# Patient Record
Sex: Male | Born: 1985 | Race: White | Hispanic: No | Marital: Single | Smoking: Current every day smoker
Health system: Southern US, Community
[De-identification: ages and names within clinical notes are randomized; demographics above are authoritative.]

## PROBLEM LIST (undated history)

## (undated) DIAGNOSIS — M5126 Other intervertebral disc displacement, lumbar region: Secondary | ICD-10-CM

## (undated) DIAGNOSIS — M1990 Unspecified osteoarthritis, unspecified site: Secondary | ICD-10-CM

## (undated) DIAGNOSIS — E162 Hypoglycemia, unspecified: Secondary | ICD-10-CM

---

## 2008-07-17 ENCOUNTER — Emergency Department (HOSPITAL_COMMUNITY): Admission: EM | Admit: 2008-07-17 | Discharge: 2008-07-17 | Payer: Self-pay | Admitting: Emergency Medicine

## 2015-12-22 ENCOUNTER — Encounter (HOSPITAL_COMMUNITY): Payer: Self-pay | Admitting: Emergency Medicine

## 2015-12-22 ENCOUNTER — Emergency Department (HOSPITAL_COMMUNITY)
Admission: EM | Admit: 2015-12-22 | Discharge: 2015-12-22 | Disposition: A | Discharge status: Home / Self Care | Payer: Self-pay | Attending: Emergency Medicine | Admitting: Emergency Medicine

## 2015-12-22 ENCOUNTER — Emergency Department (HOSPITAL_COMMUNITY): Payer: Self-pay

## 2015-12-22 DIAGNOSIS — Y9289 Other specified places as the place of occurrence of the external cause: Secondary | ICD-10-CM | POA: Insufficient documentation

## 2015-12-22 DIAGNOSIS — R61 Generalized hyperhidrosis: Secondary | ICD-10-CM | POA: Insufficient documentation

## 2015-12-22 DIAGNOSIS — W320XXA Accidental handgun discharge, initial encounter: Secondary | ICD-10-CM | POA: Insufficient documentation

## 2015-12-22 DIAGNOSIS — W3400XA Accidental discharge from unspecified firearms or gun, initial encounter: Secondary | ICD-10-CM

## 2015-12-22 DIAGNOSIS — S61402A Unspecified open wound of left hand, initial encounter: Secondary | ICD-10-CM | POA: Insufficient documentation

## 2015-12-22 DIAGNOSIS — Y998 Other external cause status: Secondary | ICD-10-CM | POA: Insufficient documentation

## 2015-12-22 DIAGNOSIS — Y9389 Activity, other specified: Secondary | ICD-10-CM | POA: Insufficient documentation

## 2015-12-22 DIAGNOSIS — F17200 Nicotine dependence, unspecified, uncomplicated: Secondary | ICD-10-CM | POA: Insufficient documentation

## 2015-12-22 MED ORDER — SODIUM CHLORIDE 0.9 % IV BOLUS (SEPSIS)
1000.0000 mL | Freq: Once | INTRAVENOUS | Status: AC
  Administered 2015-12-22: 1000 mL via INTRAVENOUS

## 2015-12-22 MED ORDER — HYDROMORPHONE HCL 1 MG/ML IJ SOLN
1.0000 mg | Freq: Once | INTRAMUSCULAR | Status: AC
  Administered 2015-12-22: 1 mg via INTRAVENOUS
  Filled 2015-12-22: qty 1

## 2015-12-22 MED ORDER — LIDOCAINE HCL (PF) 1 % IJ SOLN
30.0000 mL | Freq: Once | INTRAMUSCULAR | Status: AC
  Administered 2015-12-22: 30 mL
  Filled 2015-12-22: qty 30

## 2015-12-22 MED ORDER — ONDANSETRON HCL 4 MG/2ML IJ SOLN
4.0000 mg | Freq: Once | INTRAMUSCULAR | Status: AC
  Administered 2015-12-22: 4 mg via INTRAVENOUS
  Filled 2015-12-22: qty 2

## 2015-12-22 MED ORDER — CEFAZOLIN SODIUM 1-5 GM-% IV SOLN
1.0000 g | Freq: Once | INTRAVENOUS | Status: AC
  Administered 2015-12-22: 1 g via INTRAVENOUS
  Filled 2015-12-22: qty 50

## 2015-12-22 MED ORDER — OXYCODONE HCL 5 MG PO TABS: 5.0000 mg | ORAL_TABLET | Freq: Four times a day (QID) | ORAL | Status: DC | PRN

## 2015-12-22 MED ORDER — TETANUS-DIPHTH-ACELL PERTUSSIS 5-2.5-18.5 LF-MCG/0.5 IM SUSP
0.5000 mL | Freq: Once | INTRAMUSCULAR | Status: DC
  Filled 2015-12-22: qty 0.5

## 2015-12-22 MED ORDER — CEPHALEXIN 500 MG PO CAPS: 500.0000 mg | ORAL_CAPSULE | Freq: Two times a day (BID) | ORAL | Status: DC
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
## 2015-12-22 NOTE — ED Notes (Signed)
Patient transported to X-ray 
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
## 2015-12-25 ENCOUNTER — Inpatient Hospital Stay (HOSPITAL_COMMUNITY)
Admission: AD | Admit: 2015-12-25 | Discharge: 2015-12-30 | DRG: 464 | Disposition: A | Discharge status: Home / Self Care | Payer: Self-pay | Source: Ambulatory Visit | Attending: Orthopedic Surgery | Admitting: Orthopedic Surgery

## 2015-12-25 ENCOUNTER — Inpatient Hospital Stay (HOSPITAL_COMMUNITY): Payer: Self-pay | Admitting: Anesthesiology

## 2015-12-25 ENCOUNTER — Encounter (HOSPITAL_COMMUNITY)
Admission: AD | Disposition: A | Discharge status: Home / Self Care | Payer: Self-pay | Source: Ambulatory Visit | Attending: Orthopedic Surgery

## 2015-12-25 ENCOUNTER — Encounter (HOSPITAL_COMMUNITY): Payer: Self-pay

## 2015-12-25 DIAGNOSIS — Z882 Allergy status to sulfonamides status: Secondary | ICD-10-CM

## 2015-12-25 DIAGNOSIS — W3400XA Accidental discharge from unspecified firearms or gun, initial encounter: Secondary | ICD-10-CM

## 2015-12-25 DIAGNOSIS — Z888 Allergy status to other drugs, medicaments and biological substances status: Secondary | ICD-10-CM

## 2015-12-25 DIAGNOSIS — S62307B Unspecified fracture of fifth metacarpal bone, left hand, initial encounter for open fracture: Principal | ICD-10-CM | POA: Diagnosis present

## 2015-12-25 DIAGNOSIS — Z885 Allergy status to narcotic agent status: Secondary | ICD-10-CM

## 2015-12-25 DIAGNOSIS — F1110 Opioid abuse, uncomplicated: Secondary | ICD-10-CM | POA: Diagnosis present

## 2015-12-25 DIAGNOSIS — L0390 Cellulitis, unspecified: Secondary | ICD-10-CM | POA: Diagnosis present

## 2015-12-25 DIAGNOSIS — F17200 Nicotine dependence, unspecified, uncomplicated: Secondary | ICD-10-CM | POA: Diagnosis present

## 2015-12-25 DIAGNOSIS — S61439A Puncture wound without foreign body of unspecified hand, initial encounter: Secondary | ICD-10-CM

## 2015-12-25 HISTORY — DX: Unspecified osteoarthritis, unspecified site: M19.90

## 2015-12-25 HISTORY — DX: Other intervertebral disc displacement, lumbar region: M51.26

## 2015-12-25 HISTORY — PX: I&D EXTREMITY: SHX5045

## 2015-12-25 HISTORY — DX: Hypoglycemia, unspecified: E16.2

## 2015-12-25 LAB — CBC WITH DIFFERENTIAL/PLATELET
BASOS ABS: 0 10*3/uL (ref 0.0–0.1)
Basophils Relative: 0 %
Eosinophils Absolute: 0.1 10*3/uL (ref 0.0–0.7)
Eosinophils Relative: 1 %
HEMATOCRIT: 43.1 % (ref 39.0–52.0)
Hemoglobin: 14.4 g/dL (ref 13.0–17.0)
LYMPHS PCT: 14 %
Lymphs Abs: 1.1 10*3/uL (ref 0.7–4.0)
MCH: 29.9 pg (ref 26.0–34.0)
MCHC: 33.4 g/dL (ref 30.0–36.0)
MCV: 89.4 fL (ref 78.0–100.0)
MONO ABS: 0.7 10*3/uL (ref 0.1–1.0)
Monocytes Relative: 9 %
NEUTROS ABS: 6.2 10*3/uL (ref 1.7–7.7)
Neutrophils Relative %: 76 %
Platelets: 189 10*3/uL (ref 150–400)
RBC: 4.82 MIL/uL (ref 4.22–5.81)
RDW: 12.5 % (ref 11.5–15.5)
WBC: 8.1 10*3/uL (ref 4.0–10.5)

## 2015-12-25 LAB — RAPID URINE DRUG SCREEN, HOSP PERFORMED
Amphetamines: NOT DETECTED
Barbiturates: NOT DETECTED
Benzodiazepines: POSITIVE — AB
Cocaine: NOT DETECTED
OPIATES: POSITIVE — AB
Tetrahydrocannabinol: POSITIVE — AB

## 2015-12-25 LAB — URINALYSIS, ROUTINE W REFLEX MICROSCOPIC
BILIRUBIN URINE: NEGATIVE
Glucose, UA: NEGATIVE mg/dL
HGB URINE DIPSTICK: NEGATIVE
Ketones, ur: NEGATIVE mg/dL
Leukocytes, UA: NEGATIVE
Nitrite: NEGATIVE
PROTEIN: NEGATIVE mg/dL
Specific Gravity, Urine: 1.039 — ABNORMAL HIGH (ref 1.005–1.030)
pH: 6 (ref 5.0–8.0)

## 2015-12-25 LAB — COMPREHENSIVE METABOLIC PANEL
ALK PHOS: 85 U/L (ref 38–126)
ALT: 23 U/L (ref 17–63)
AST: 29 U/L (ref 15–41)
Albumin: 3.6 g/dL (ref 3.5–5.0)
Anion gap: 11 (ref 5–15)
BILIRUBIN TOTAL: 0.3 mg/dL (ref 0.3–1.2)
BUN: 15 mg/dL (ref 6–20)
CALCIUM: 9.1 mg/dL (ref 8.9–10.3)
CO2: 27 mmol/L (ref 22–32)
CREATININE: 0.91 mg/dL (ref 0.61–1.24)
Chloride: 104 mmol/L (ref 101–111)
GFR calc Af Amer: >60 mL/min (ref >60)
Glucose, Bld: 90 mg/dL (ref 65–99)
Potassium: 3.9 mmol/L (ref 3.5–5.1)
Sodium: 142 mmol/L (ref 135–145)
TOTAL PROTEIN: 6.7 g/dL (ref 6.5–8.1)

## 2015-12-25 SURGERY — IRRIGATION AND DEBRIDEMENT EXTREMITY
Anesthesia: General | Laterality: Left

## 2015-12-25 MED ORDER — IBUPROFEN 200 MG PO TABS
800.0000 mg | ORAL_TABLET | Freq: Three times a day (TID) | ORAL | Status: DC | PRN
  Administered 2015-12-25 – 2015-12-29 (×4): 800 mg via ORAL
  Filled 2015-12-25 (×4): qty 4

## 2015-12-25 MED ORDER — MEPERIDINE HCL 25 MG/ML IJ SOLN: 6.2500 mg | INTRAMUSCULAR | Status: DC | PRN

## 2015-12-25 MED ORDER — SUGAMMADEX SODIUM 200 MG/2ML IV SOLN
INTRAVENOUS | Status: DC | PRN
  Administered 2015-12-25: 120 mg via INTRAVENOUS

## 2015-12-25 MED ORDER — LORATADINE 10 MG PO TABS
10.0000 mg | ORAL_TABLET | Freq: Every day | ORAL | Status: DC
  Administered 2015-12-26 – 2015-12-30 (×4): 10 mg via ORAL
  Filled 2015-12-25 (×5): qty 1

## 2015-12-25 MED ORDER — METHOCARBAMOL 500 MG PO TABS
500.0000 mg | ORAL_TABLET | Freq: Four times a day (QID) | ORAL | Status: DC | PRN
  Administered 2015-12-25 – 2015-12-29 (×7): 500 mg via ORAL
  Filled 2015-12-25 (×6): qty 1

## 2015-12-25 MED ORDER — SODIUM CHLORIDE 0.9 % IR SOLN
Status: DC | PRN
  Administered 2015-12-25: 1000 mL

## 2015-12-25 MED ORDER — SUCCINYLCHOLINE CHLORIDE 200 MG/10ML IV SOSY
PREFILLED_SYRINGE | INTRAVENOUS | Status: AC
  Filled 2015-12-25: qty 10

## 2015-12-25 MED ORDER — ONDANSETRON HCL 4 MG/2ML IJ SOLN
INTRAMUSCULAR | Status: DC | PRN
  Administered 2015-12-25: 4 mg via INTRAVENOUS

## 2015-12-25 MED ORDER — LACTATED RINGERS IV SOLN
INTRAVENOUS | Status: DC | PRN
  Administered 2015-12-25 (×2): via INTRAVENOUS

## 2015-12-25 MED ORDER — DOCUSATE SODIUM 100 MG PO CAPS
100.0000 mg | ORAL_CAPSULE | Freq: Two times a day (BID) | ORAL | Status: DC
  Administered 2015-12-26 – 2015-12-30 (×8): 100 mg via ORAL
  Filled 2015-12-25 (×9): qty 1

## 2015-12-25 MED ORDER — SUCCINYLCHOLINE CHLORIDE 20 MG/ML IJ SOLN
INTRAMUSCULAR | Status: DC | PRN
  Administered 2015-12-25: 120 mg via INTRAVENOUS

## 2015-12-25 MED ORDER — CEFAZOLIN SODIUM-DEXTROSE 2-3 GM-% IV SOLR
INTRAVENOUS | Status: DC | PRN
  Administered 2015-12-25: 2 g via INTRAVENOUS

## 2015-12-25 MED ORDER — ACETAMINOPHEN 325 MG PO TABS
650.0000 mg | ORAL_TABLET | ORAL | Status: DC | PRN
  Administered 2015-12-25 – 2015-12-28 (×3): 650 mg via ORAL
  Filled 2015-12-25 (×2): qty 2

## 2015-12-25 MED ORDER — VANCOMYCIN HCL 10 G IV SOLR
1250.0000 mg | Freq: Two times a day (BID) | INTRAVENOUS | Status: DC
  Administered 2015-12-25 – 2015-12-30 (×9): 1250 mg via INTRAVENOUS
  Filled 2015-12-25 (×11): qty 1250

## 2015-12-25 MED ORDER — OXYCODONE HCL 5 MG PO TABS
ORAL_TABLET | ORAL | Status: AC
  Filled 2015-12-25: qty 2

## 2015-12-25 MED ORDER — HYDROMORPHONE HCL 1 MG/ML IJ SOLN
INTRAMUSCULAR | Status: AC
  Filled 2015-12-25: qty 1

## 2015-12-25 MED ORDER — LIDOCAINE HCL (CARDIAC) 20 MG/ML IV SOLN
INTRAVENOUS | Status: DC | PRN
  Administered 2015-12-25: 100 mg via INTRAVENOUS

## 2015-12-25 MED ORDER — METHOCARBAMOL 500 MG PO TABS
ORAL_TABLET | ORAL | Status: AC
  Filled 2015-12-25: qty 1

## 2015-12-25 MED ORDER — ONDANSETRON HCL 4 MG PO TABS
4.0000 mg | ORAL_TABLET | Freq: Four times a day (QID) | ORAL | Status: DC | PRN
Start: 2015-12-25 — End: 2015-12-30

## 2015-12-25 MED ORDER — ALPRAZOLAM 0.5 MG PO TABS
0.5000 mg | ORAL_TABLET | Freq: Four times a day (QID) | ORAL | Status: DC | PRN
  Administered 2015-12-25 – 2015-12-28 (×4): 0.5 mg via ORAL
  Filled 2015-12-25 (×4): qty 1

## 2015-12-25 MED ORDER — PROMETHAZINE HCL 25 MG RE SUPP: 12.5000 mg | Freq: Four times a day (QID) | RECTAL | Status: DC | PRN

## 2015-12-25 MED ORDER — LIDOCAINE 2% (20 MG/ML) 5 ML SYRINGE
INTRAMUSCULAR | Status: AC
  Filled 2015-12-25: qty 5

## 2015-12-25 MED ORDER — METHOCARBAMOL 1000 MG/10ML IJ SOLN
500.0000 mg | Freq: Four times a day (QID) | INTRAVENOUS | Status: DC | PRN
  Filled 2015-12-25: qty 5

## 2015-12-25 MED ORDER — MIDAZOLAM HCL 2 MG/2ML IJ SOLN
INTRAMUSCULAR | Status: AC
  Filled 2015-12-25: qty 2

## 2015-12-25 MED ORDER — MORPHINE SULFATE (PF) 2 MG/ML IV SOLN
2.0000 mg | INTRAVENOUS | Status: DC | PRN
  Administered 2015-12-27 (×2): 4 mg via INTRAVENOUS
  Administered 2015-12-27: 2 mg via INTRAVENOUS
  Administered 2015-12-27 – 2015-12-28 (×5): 4 mg via INTRAVENOUS
  Filled 2015-12-25 (×8): qty 2

## 2015-12-25 MED ORDER — PROPOFOL 10 MG/ML IV BOLUS
INTRAVENOUS | Status: DC | PRN
  Administered 2015-12-25: 200 mg via INTRAVENOUS

## 2015-12-25 MED ORDER — SENNA 8.6 MG PO TABS
1.0000 | ORAL_TABLET | Freq: Two times a day (BID) | ORAL | Status: DC
  Administered 2015-12-26 – 2015-12-30 (×8): 8.6 mg via ORAL
  Filled 2015-12-25 (×9): qty 1

## 2015-12-25 MED ORDER — ONDANSETRON HCL 4 MG/2ML IJ SOLN: 4.0000 mg | Freq: Four times a day (QID) | INTRAMUSCULAR | Status: DC | PRN

## 2015-12-25 MED ORDER — FENTANYL CITRATE (PF) 250 MCG/5ML IJ SOLN
INTRAMUSCULAR | Status: AC
  Filled 2015-12-25: qty 5

## 2015-12-25 MED ORDER — POLYETHYLENE GLYCOL 3350 17 G PO PACK: 17.0000 g | PACK | Freq: Every day | ORAL | Status: DC | PRN

## 2015-12-25 MED ORDER — SODIUM CHLORIDE 0.9 % IV SOLN
1.5000 g | Freq: Four times a day (QID) | INTRAVENOUS | Status: DC
  Administered 2015-12-25 – 2015-12-30 (×18): 1.5 g via INTRAVENOUS
  Filled 2015-12-25 (×21): qty 1.5

## 2015-12-25 MED ORDER — MAGNESIUM CITRATE PO SOLN: 1.0000 | Freq: Once | ORAL | Status: DC | PRN

## 2015-12-25 MED ORDER — DIPHENHYDRAMINE HCL 25 MG PO CAPS
25.0000 mg | ORAL_CAPSULE | Freq: Four times a day (QID) | ORAL | Status: DC | PRN
  Administered 2015-12-28: 25 mg via ORAL
  Filled 2015-12-25: qty 1

## 2015-12-25 MED ORDER — ONDANSETRON HCL 4 MG/2ML IJ SOLN
INTRAMUSCULAR | Status: AC
  Filled 2015-12-25: qty 2

## 2015-12-25 MED ORDER — BISACODYL 10 MG RE SUPP: 10.0000 mg | Freq: Every day | RECTAL | Status: DC | PRN

## 2015-12-25 MED ORDER — FENTANYL CITRATE (PF) 100 MCG/2ML IJ SOLN
INTRAMUSCULAR | Status: DC | PRN
Start: 2015-12-25 — End: 2015-12-25
  Administered 2015-12-25: 150 ug via INTRAVENOUS
  Administered 2015-12-25: 100 ug via INTRAVENOUS

## 2015-12-25 MED ORDER — LACTATED RINGERS IV SOLN
INTRAVENOUS | Status: DC
  Administered 2015-12-25: 17:00:00 via INTRAVENOUS

## 2015-12-25 MED ORDER — POTASSIUM CHLORIDE IN NACL 20-0.45 MEQ/L-% IV SOLN
INTRAVENOUS | Status: DC
  Administered 2015-12-25 – 2015-12-27 (×3): via INTRAVENOUS
  Filled 2015-12-25 (×14): qty 1000

## 2015-12-25 MED ORDER — VITAMIN C 500 MG PO TABS
1000.0000 mg | ORAL_TABLET | Freq: Every day | ORAL | Status: DC
  Administered 2015-12-26 – 2015-12-30 (×4): 1000 mg via ORAL
  Filled 2015-12-25 (×5): qty 2

## 2015-12-25 MED ORDER — FAMOTIDINE 20 MG PO TABS: 20.0000 mg | ORAL_TABLET | Freq: Two times a day (BID) | ORAL | Status: DC | PRN

## 2015-12-25 MED ORDER — MIDAZOLAM HCL 5 MG/5ML IJ SOLN
INTRAMUSCULAR | Status: DC | PRN
  Administered 2015-12-25: 2 mg via INTRAVENOUS

## 2015-12-25 MED ORDER — ONDANSETRON HCL 4 MG/2ML IJ SOLN: 4.0000 mg | Freq: Once | INTRAMUSCULAR | Status: DC | PRN

## 2015-12-25 MED ORDER — ROCURONIUM BROMIDE 100 MG/10ML IV SOLN
INTRAVENOUS | Status: DC | PRN
  Administered 2015-12-25: 30 mg via INTRAVENOUS

## 2015-12-25 MED ORDER — ACETAMINOPHEN 325 MG PO TABS
ORAL_TABLET | ORAL | Status: AC
  Filled 2015-12-25: qty 2

## 2015-12-25 MED ORDER — ROCURONIUM BROMIDE 50 MG/5ML IV SOLN
INTRAVENOUS | Status: AC
  Filled 2015-12-25: qty 1

## 2015-12-25 MED ORDER — B COMPLEX-C PO TABS
1.0000 | ORAL_TABLET | Freq: Every day | ORAL | Status: DC
  Administered 2015-12-26 – 2015-12-30 (×4): 1 via ORAL
  Filled 2015-12-25 (×5): qty 1

## 2015-12-25 MED ORDER — HYDROMORPHONE HCL 1 MG/ML IJ SOLN
0.2500 mg | INTRAMUSCULAR | Status: DC | PRN
  Administered 2015-12-25 (×4): 0.5 mg via INTRAVENOUS

## 2015-12-25 MED ORDER — OXYCODONE HCL 5 MG PO TABS
5.0000 mg | ORAL_TABLET | ORAL | Status: DC | PRN
  Administered 2015-12-25 – 2015-12-26 (×4): 10 mg via ORAL
  Administered 2015-12-26: 5 mg via ORAL
  Administered 2015-12-26 – 2015-12-29 (×11): 10 mg via ORAL
  Filled 2015-12-25 (×15): qty 2

## 2015-12-25 MED ORDER — SUGAMMADEX SODIUM 200 MG/2ML IV SOLN
INTRAVENOUS | Status: AC
  Filled 2015-12-25: qty 2

## 2015-12-25 SURGICAL SUPPLY — 49 items
BANDAGE ELASTIC 4 VELCRO ST LF (GAUZE/BANDAGES/DRESSINGS) IMPLANT
BNDG CONFORM 2 STRL LF (GAUZE/BANDAGES/DRESSINGS) IMPLANT
BNDG GAUZE ELAST 4 BULKY (GAUZE/BANDAGES/DRESSINGS) IMPLANT
CORDS BIPOLAR (ELECTRODE) ×3 IMPLANT
CUFF TOURNIQUET SINGLE 18IN (TOURNIQUET CUFF) ×3 IMPLANT
CUFF TOURNIQUET SINGLE 24IN (TOURNIQUET CUFF) IMPLANT
DRAPE OEC MINIVIEW 54X84 (DRAPES) ×3 IMPLANT
DRSG ADAPTIC 3X8 NADH LF (GAUZE/BANDAGES/DRESSINGS) ×3 IMPLANT
GAUZE SPONGE 4X4 12PLY STRL (GAUZE/BANDAGES/DRESSINGS) IMPLANT
GAUZE SPONGE 4X4 16PLY XRAY LF (GAUZE/BANDAGES/DRESSINGS) ×3 IMPLANT
GAUZE XEROFORM 1X8 LF (GAUZE/BANDAGES/DRESSINGS) IMPLANT
GAUZE XEROFORM 5X9 LF (GAUZE/BANDAGES/DRESSINGS) ×3 IMPLANT
GLOVE BIOGEL M 8.0 STRL (GLOVE) ×3 IMPLANT
GLOVE BIOGEL PI IND STRL 7.5 (GLOVE) ×1 IMPLANT
GLOVE BIOGEL PI INDICATOR 7.5 (GLOVE) ×2
GLOVE SS BIOGEL STRL SZ 8 (GLOVE) ×1 IMPLANT
GLOVE SUPERSENSE BIOGEL SZ 8 (GLOVE) ×2
GOWN STRL REUS W/ TWL LRG LVL3 (GOWN DISPOSABLE) ×1 IMPLANT
GOWN STRL REUS W/ TWL XL LVL3 (GOWN DISPOSABLE) ×1 IMPLANT
GOWN STRL REUS W/TWL LRG LVL3 (GOWN DISPOSABLE) ×2
GOWN STRL REUS W/TWL XL LVL3 (GOWN DISPOSABLE) ×2
HANDPIECE INTERPULSE COAX TIP (DISPOSABLE)
KIT BASIN OR (CUSTOM PROCEDURE TRAY) ×3 IMPLANT
KIT ROOM TURNOVER OR (KITS) ×3 IMPLANT
LOOP VESSEL MAXI BLUE (MISCELLANEOUS) ×6 IMPLANT
MANIFOLD NEPTUNE II (INSTRUMENTS) ×3 IMPLANT
NEEDLE HYPO 25GX1X1/2 BEV (NEEDLE) IMPLANT
NS IRRIG 1000ML POUR BTL (IV SOLUTION) ×3 IMPLANT
PACK ORTHO EXTREMITY (CUSTOM PROCEDURE TRAY) ×3 IMPLANT
PAD ARMBOARD 7.5X6 YLW CONV (MISCELLANEOUS) ×3 IMPLANT
PAD CAST 4YDX4 CTTN HI CHSV (CAST SUPPLIES) IMPLANT
PADDING CAST ABS 4INX4YD NS (CAST SUPPLIES) ×2
PADDING CAST ABS COTTON 4X4 ST (CAST SUPPLIES) ×1 IMPLANT
PADDING CAST COTTON 4X4 STRL (CAST SUPPLIES)
PILLOW ARM CARTER ADULT (MISCELLANEOUS) ×3 IMPLANT
SET HNDPC FAN SPRY TIP SCT (DISPOSABLE) IMPLANT
SPONGE GAUZE 4X4 12PLY STER LF (GAUZE/BANDAGES/DRESSINGS) ×3 IMPLANT
SPONGE LAP 4X18 X RAY DECT (DISPOSABLE) IMPLANT
SUCTION FRAZIER HANDLE 10FR (MISCELLANEOUS) ×2
SUCTION TUBE FRAZIER 10FR DISP (MISCELLANEOUS) ×1 IMPLANT
SWAB COLLECTION DEVICE MRSA (MISCELLANEOUS) ×3 IMPLANT
SYR CONTROL 10ML LL (SYRINGE) IMPLANT
TOWEL OR 17X24 6PK STRL BLUE (TOWEL DISPOSABLE) ×6 IMPLANT
TOWEL OR 17X26 10 PK STRL BLUE (TOWEL DISPOSABLE) ×3 IMPLANT
TUBE ANAEROBIC SPECIMEN COL (MISCELLANEOUS) IMPLANT
TUBE CONNECTING 12'X1/4 (SUCTIONS) ×1
TUBE CONNECTING 12X1/4 (SUCTIONS) ×2 IMPLANT
WATER STERILE IRR 1000ML POUR (IV SOLUTION) ×3 IMPLANT
YANKAUER SUCT BULB TIP NO VENT (SUCTIONS) ×3 IMPLANT

## 2015-12-25 NOTE — H&P (Deleted)
  Patient seen and examined Patient is doing fairly well.  Neurovascularly he is stable about the right and left upper extremities  We'll plan for irrigation debridement possible skin grafting and repair reconstruction left arm is necessary.  We'll plan for dressing change right upper extremity and irrigation debridement of the skin.  These notes a been discussed all questions addressed.  We are planning surgery for your upper extremity. The risk and benefits of surgery to include risk of bleeding, infection, anesthesia,  damage to normal structures and failure of the surgery to accomplish its intended goals of relieving symptoms and restoring function have been discussed in detail. With this in mind we plan to proceed. I have specifically discussed with the patient the pre-and postoperative regime and the dos and don'ts and risk and benefits in great detail. Risk and benefits of surgery also include risk of dystrophy(CRPS), chronic nerve pain, failure of the healing process to go onto completion and other inherent risks of surgery The relavent the pathophysiology of the disease/injury process, as well as the alternatives for treatment and postoperative course of action has been discussed in great detail with the patient who desires to proceed.  We will do everything in our power to help you (the patient) restore function to the upper extremity. It is a pleasure to see this patient today.  Jensyn Shave MD
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
## 2015-12-25 NOTE — Transfer of Care (Signed)
Immediate Anesthesia Transfer of Care Note  Patient: Bobby Henderson  Procedure(s) Performed: Procedure(s): IRRIGATION AND DEBRIDEMENT EXTREMITY (Left)  Patient Location: PACU  Anesthesia Type:General  Level of Consciousness: awake, alert , oriented and patient cooperative  Airway & Oxygen Therapy: Patient Spontanous Breathing and Patient connected to nasal cannula oxygen  Post-op Assessment: Report given to RN, Post -op Vital signs reviewed and stable and Patient moving all extremities X 4  Post vital signs: Reviewed and stable  Last Vitals:  Filed Vitals:   12/25/15 1604 12/25/15 1939  BP: 144/86   Pulse: 80   Temp: 36.8 C 36.8 C  Resp: 18     Last Pain:  Filed Vitals:   12/25/15 1941  PainSc: 5       Patients Stated Pain Goal: 2 (12/25/15 1619)  Complications: No apparent anesthesia complications

## 2015-12-25 NOTE — Progress Notes (Signed)
Pt now able to void, urine sample sent to lab for urinalysis and drug screen.

## 2015-12-25 NOTE — Anesthesia Postprocedure Evaluation (Signed)
Anesthesia Post Note  Patient: Bobby Henderson  Procedure(s) Performed: Procedure(s) (LRB): IRRIGATION AND DEBRIDEMENT EXTREMITY (Left)  Patient location during evaluation: PACU Anesthesia Type: General Level of consciousness: awake and alert, oriented and patient cooperative Pain control: pain improving. Vital Signs Assessment: post-procedure vital signs reviewed and stable Respiratory status: spontaneous breathing, nonlabored ventilation and respiratory function stable Cardiovascular status: blood pressure returned to baseline and stable Postop Assessment: no signs of nausea or vomiting Anesthetic complications: no    Last Vitals:  Filed Vitals:   12/25/15 2043 12/25/15 2108  BP:  128/77  Pulse: 73 75  Temp: 36.8 C 36.9 C  Resp: 13 16    Last Pain:  Filed Vitals:   12/25/15 2234  PainSc: 8                  Andray Assefa,E. Raygen Dahm

## 2015-12-25 NOTE — Op Note (Signed)
See [id number]#[id number] Bobby PeaGramig MD
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
## 2015-12-25 NOTE — Anesthesia Procedure Notes (Signed)
Procedure Name: Intubation Date/Time: 12/25/2015 6:22 PM Performed by: Izora Gala Pre-anesthesia Checklist: Emergency Drugs available, Suction available, Patient being monitored and Patient identified Patient Re-evaluated:Patient Re-evaluated prior to inductionOxygen Delivery Method: Circle system utilized Preoxygenation: Pre-oxygenation with 100% oxygen Intubation Type: IV induction Ventilation: Mask ventilation without difficulty Laryngoscope Size: Miller and 3 Grade View: Grade I Tube type: Oral Tube size: 7.5 mm Number of attempts: 1 Airway Equipment and Method: Stylet and LTA kit utilized Placement Confirmation: ETT inserted through vocal cords under direct vision,  positive ETCO2 and breath sounds checked- equal and bilateral Secured at: 21 cm Tube secured with: Tape Dental Injury: Teeth and Oropharynx as per pre-operative assessment

## 2015-12-25 NOTE — Anesthesia Preprocedure Evaluation (Signed)
Anesthesia Evaluation  Patient identified by MRN, date of birth, ID band Patient awake    Reviewed: Allergy & Precautions, NPO status , Patient's Chart, lab work & pertinent test results  Airway Mallampati: I  TM Distance: >3 FB Neck ROM: Full    Dental   Pulmonary Current Smoker,    Pulmonary exam normal        Cardiovascular Normal cardiovascular exam     Neuro/Psych    GI/Hepatic   Endo/Other    Renal/GU      Musculoskeletal   Abdominal   Peds  Hematology   Anesthesia Other Findings   Reproductive/Obstetrics                             Anesthesia Physical Anesthesia Plan  ASA: II  Anesthesia Plan: General   Post-op Pain Management:    Induction: Intravenous  Airway Management Planned: Oral ETT  Additional Equipment:   Intra-op Plan:   Post-operative Plan: Extubation in OR  Informed Consent: I have reviewed the patients History and Physical, chart, labs and discussed the procedure including the risks, benefits and alternatives for the proposed anesthesia with the patient or authorized representative who has indicated his/her understanding and acceptance.     Plan Discussed with: CRNA and Surgeon  Anesthesia Plan Comments:         Anesthesia Quick Evaluation  

## 2015-12-26 LAB — C-REACTIVE PROTEIN: CRP: 6.3 mg/dL — ABNORMAL HIGH (ref <1.0)

## 2015-12-26 LAB — SEDIMENTATION RATE: Sed Rate: 15 mm/hr (ref 0–16)

## 2015-12-26 NOTE — Progress Notes (Signed)
Subjective: 1 Day Post-Op Procedure(s) (LRB): IRRIGATION AND DEBRIDEMENT EXTREMITY (Left) Patient reports being anxious and mild tenderness. Denies fever or chills, shortness of breath, chest pain.  Objective: Vital signs in last 24 hours: Temp:  [97.7 F (36.5 C)-98.4 F (36.9 C)] 98.4 F (36.9 C) (05/06 1211) Pulse Rate:  [53-83] 74 (05/06 1211) Resp:  [7-17] 15 (05/06 1211) BP: (100-153)/(61-99) 117/64 mmHg (05/06 1211) SpO2:  [99 %-100 %] 100 % (05/06 1211)  Intake/Output from previous day: 05/05 0701 - 05/06 0700 In: 2431.7 [P.O.:120; I.V.:1961.7; IV Piggyback:350] Out: 5 [Blood:5] Intake/Output this shift: Total I/O In: 120 [P.O.:120] Out: -    Recent Labs  12/25/15 1618  HGB 14.4    Recent Labs  12/25/15 1618  WBC 8.1  RBC 4.82  HCT 43.1  PLT 189    Recent Labs  12/25/15 1618  NA 142  K 3.9  CL 104  CO2 27  BUN 15  CREATININE 0.91  GLUCOSE 90  CALCIUM 9.1   No results for input(s): LABPT, INR in the last 72 hours.  Patient is pleasant, in no acute distress. He is sitting up in a chair. He is tolerating by mouth's without difficulties.  HEENT: Noted abrasion with mild ecchymosis about the left periorbital region Chest equal expansions are present respirations are nonlabored Abdomen nontender Left upper extremity: His Ace wrap as and April repair this appears to have been unwrapped and rewrapped. He states that it felt too tight and he asked the nurse to rewrap it. Range of motion intact sensation refill are intact. There is no signs of ascending cellulitis present. I have rewrapped his splint.  Assessment/Plan: 1 Day Post-Op Procedure(s) (LRB): IRRIGATION AND DEBRIDEMENT EXTREMITY (Left) We have discussed with him the extensive nature of his infection and the need to return to the operative suite tomorrow for repeat irrigation and excisional debridement. He will most likely need some degree of definitive bone fixation in the near future once the  infection subsides. We discussed these issues with him. Intraoperative cultures are still pending. We will plan for nothing by mouth after midnight tonight and will return to the operative suite tomorrow. We have discussed with the patient the issues regarding their infection to the extremity. We will continue antibiotics and await culture results. Often times it will take 3-5 days for cultures to become final. During this time we will typically have the patient on intravenous antibiotics until we can find a parenteral route of antibiotic regime specific for the bacteria or organism isolated. We have discussed with the patient the need for daily irrigation and debridement as well as therapy to the area. We have discussed with the patient the necessity of range of motion to the involved joints as discussed today. We have discussed with the patient the unpredictability of infections at times. We'll continue to work towards good pain control and restoration of function. The patient understands the need for meticulous wound care and the necessity of proper followup.  The possible complications of stiffness (loss of motion), resistant infection, possible deep bone infection, possible chronic pain issues, possible need for multiple surgeries and even amputation.  With this in mind the patient understands our goal is to eradicate the infection to quiesence. We will continue to work towards these goals.  Bobby Henderson 12/26/2015, 5:08 PM
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
## 2015-12-27 ENCOUNTER — Encounter (HOSPITAL_COMMUNITY)
Admission: AD | Disposition: A | Discharge status: Home / Self Care | Payer: Self-pay | Source: Ambulatory Visit | Attending: Orthopedic Surgery

## 2015-12-27 ENCOUNTER — Encounter (HOSPITAL_COMMUNITY): Payer: Self-pay | Admitting: Anesthesiology

## 2015-12-27 ENCOUNTER — Inpatient Hospital Stay (HOSPITAL_COMMUNITY): Payer: Self-pay | Admitting: Anesthesiology

## 2015-12-27 HISTORY — PX: I&D EXTREMITY: SHX5045

## 2015-12-27 LAB — GLUCOSE, CAPILLARY: Glucose-Capillary: 106 mg/dL — ABNORMAL HIGH (ref 65–99)

## 2015-12-27 LAB — HEPATIC FUNCTION PANEL
ALK PHOS: 78 U/L (ref 38–126)
ALT: 23 U/L (ref 17–63)
AST: 28 U/L (ref 15–41)
Albumin: 3.3 g/dL — ABNORMAL LOW (ref 3.5–5.0)
BILIRUBIN TOTAL: 0.4 mg/dL (ref 0.3–1.2)
Total Protein: 6.3 g/dL — ABNORMAL LOW (ref 6.5–8.1)

## 2015-12-27 SURGERY — IRRIGATION AND DEBRIDEMENT EXTREMITY
Anesthesia: General | Site: Hand | Laterality: Left

## 2015-12-27 MED ORDER — MEPERIDINE HCL 25 MG/ML IJ SOLN: 6.2500 mg | INTRAMUSCULAR | Status: DC | PRN

## 2015-12-27 MED ORDER — ONDANSETRON HCL 4 MG/2ML IJ SOLN
INTRAMUSCULAR | Status: AC
  Filled 2015-12-27: qty 2

## 2015-12-27 MED ORDER — LIDOCAINE 2% (20 MG/ML) 5 ML SYRINGE
INTRAMUSCULAR | Status: AC
  Filled 2015-12-27: qty 5

## 2015-12-27 MED ORDER — HYDROMORPHONE HCL 1 MG/ML IJ SOLN
0.2500 mg | INTRAMUSCULAR | Status: DC | PRN
Start: 2015-12-27 — End: 2015-12-27
  Administered 2015-12-27 (×4): 0.5 mg via INTRAVENOUS

## 2015-12-27 MED ORDER — FENTANYL CITRATE (PF) 100 MCG/2ML IJ SOLN
INTRAMUSCULAR | Status: DC | PRN
  Administered 2015-12-27 (×2): 100 ug via INTRAVENOUS
  Administered 2015-12-27 (×2): 50 ug via INTRAVENOUS
  Administered 2015-12-27 (×2): 100 ug via INTRAVENOUS

## 2015-12-27 MED ORDER — SODIUM CHLORIDE 0.9 % IR SOLN
Status: DC | PRN
  Administered 2015-12-27: 3000 mL

## 2015-12-27 MED ORDER — PROPOFOL 10 MG/ML IV BOLUS
INTRAVENOUS | Status: AC
  Filled 2015-12-27: qty 20

## 2015-12-27 MED ORDER — MIDAZOLAM HCL 5 MG/5ML IJ SOLN
INTRAMUSCULAR | Status: DC | PRN
  Administered 2015-12-27: 2 mg via INTRAVENOUS

## 2015-12-27 MED ORDER — EPHEDRINE 5 MG/ML INJ
INTRAVENOUS | Status: AC
  Filled 2015-12-27: qty 10

## 2015-12-27 MED ORDER — FENTANYL CITRATE (PF) 250 MCG/5ML IJ SOLN
INTRAMUSCULAR | Status: AC
  Filled 2015-12-27: qty 5

## 2015-12-27 MED ORDER — SUCCINYLCHOLINE CHLORIDE 200 MG/10ML IV SOSY
PREFILLED_SYRINGE | INTRAVENOUS | Status: AC
  Filled 2015-12-27: qty 10

## 2015-12-27 MED ORDER — PHENYLEPHRINE 40 MCG/ML (10ML) SYRINGE FOR IV PUSH (FOR BLOOD PRESSURE SUPPORT)
PREFILLED_SYRINGE | INTRAVENOUS | Status: AC
  Filled 2015-12-27: qty 10

## 2015-12-27 MED ORDER — MIDAZOLAM HCL 2 MG/2ML IJ SOLN
INTRAMUSCULAR | Status: AC
  Filled 2015-12-27: qty 2

## 2015-12-27 MED ORDER — LIDOCAINE HCL (CARDIAC) 20 MG/ML IV SOLN
INTRAVENOUS | Status: DC | PRN
  Administered 2015-12-27: 50 mg via INTRAVENOUS

## 2015-12-27 MED ORDER — LACTATED RINGERS IV SOLN
INTRAVENOUS | Status: DC | PRN
  Administered 2015-12-27: 07:00:00 via INTRAVENOUS

## 2015-12-27 MED ORDER — 0.9 % SODIUM CHLORIDE (POUR BTL) OPTIME
TOPICAL | Status: DC | PRN
  Administered 2015-12-27: 1000 mL

## 2015-12-27 MED ORDER — HYDROMORPHONE HCL 1 MG/ML IJ SOLN
INTRAMUSCULAR | Status: AC
Start: 2015-12-27 — End: 2015-12-28
  Filled 2015-12-27: qty 2

## 2015-12-27 MED ORDER — ARTIFICIAL TEARS OP OINT
TOPICAL_OINTMENT | OPHTHALMIC | Status: AC
  Filled 2015-12-27: qty 3.5

## 2015-12-27 MED ORDER — MIDAZOLAM HCL 2 MG/2ML IJ SOLN: 0.5000 mg | Freq: Once | INTRAMUSCULAR | Status: DC | PRN

## 2015-12-27 MED ORDER — ONDANSETRON HCL 4 MG/2ML IJ SOLN
INTRAMUSCULAR | Status: DC | PRN
  Administered 2015-12-27: 4 mg via INTRAVENOUS

## 2015-12-27 MED ORDER — PROPOFOL 10 MG/ML IV BOLUS
INTRAVENOUS | Status: DC | PRN
  Administered 2015-12-27: 200 mg via INTRAVENOUS
  Administered 2015-12-27: 50 mg via INTRAVENOUS

## 2015-12-27 SURGICAL SUPPLY — 47 items
BANDAGE ACE 4X5 VEL STRL LF (GAUZE/BANDAGES/DRESSINGS) ×6 IMPLANT
BANDAGE ELASTIC 4 VELCRO ST LF (GAUZE/BANDAGES/DRESSINGS) IMPLANT
BNDG CONFORM 2 STRL LF (GAUZE/BANDAGES/DRESSINGS) IMPLANT
BNDG GAUZE ELAST 4 BULKY (GAUZE/BANDAGES/DRESSINGS) ×6 IMPLANT
CORDS BIPOLAR (ELECTRODE) ×3 IMPLANT
COVER LIGHT HANDLE STERIS (MISCELLANEOUS) ×3 IMPLANT
CUFF TOURNIQUET SINGLE 18IN (TOURNIQUET CUFF) ×3 IMPLANT
CUFF TOURNIQUET SINGLE 24IN (TOURNIQUET CUFF) IMPLANT
DRSG ADAPTIC 3X8 NADH LF (GAUZE/BANDAGES/DRESSINGS) ×3 IMPLANT
GAUZE SPONGE 4X4 12PLY STRL (GAUZE/BANDAGES/DRESSINGS) IMPLANT
GAUZE XEROFORM 1X8 LF (GAUZE/BANDAGES/DRESSINGS) IMPLANT
GAUZE XEROFORM 5X9 LF (GAUZE/BANDAGES/DRESSINGS) ×3 IMPLANT
GLOVE BIOGEL M 8.0 STRL (GLOVE) ×3 IMPLANT
GLOVE BIOGEL PI IND STRL 7.5 (GLOVE) ×1 IMPLANT
GLOVE BIOGEL PI INDICATOR 7.5 (GLOVE) ×2
GLOVE SS BIOGEL STRL SZ 8 (GLOVE) IMPLANT
GLOVE SUPERSENSE BIOGEL SZ 8 (GLOVE)
GOWN STRL REUS W/ TWL LRG LVL3 (GOWN DISPOSABLE) ×1 IMPLANT
GOWN STRL REUS W/ TWL XL LVL3 (GOWN DISPOSABLE) ×1 IMPLANT
GOWN STRL REUS W/TWL LRG LVL3 (GOWN DISPOSABLE) ×2
GOWN STRL REUS W/TWL XL LVL3 (GOWN DISPOSABLE) ×2
HANDPIECE INTERPULSE COAX TIP (DISPOSABLE)
KIT BASIN OR (CUSTOM PROCEDURE TRAY) ×3 IMPLANT
KIT ROOM TURNOVER OR (KITS) ×3 IMPLANT
LOOP VESSEL MAXI BLUE (MISCELLANEOUS) ×3 IMPLANT
MANIFOLD NEPTUNE II (INSTRUMENTS) ×3 IMPLANT
NEEDLE HYPO 25GX1X1/2 BEV (NEEDLE) ×3 IMPLANT
NS IRRIG 1000ML POUR BTL (IV SOLUTION) ×3 IMPLANT
PACK ORTHO EXTREMITY (CUSTOM PROCEDURE TRAY) ×3 IMPLANT
PAD ARMBOARD 7.5X6 YLW CONV (MISCELLANEOUS) ×3 IMPLANT
PAD CAST 4YDX4 CTTN HI CHSV (CAST SUPPLIES) ×1 IMPLANT
PADDING CAST COTTON 4X4 STRL (CAST SUPPLIES) ×2
SET CYSTO W/LG BORE CLAMP LF (SET/KITS/TRAYS/PACK) ×3 IMPLANT
SET HNDPC FAN SPRY TIP SCT (DISPOSABLE) IMPLANT
SPLINT FIBERGLASS 4X30 (CAST SUPPLIES) ×3 IMPLANT
SPONGE GAUZE 4X4 12PLY STER LF (GAUZE/BANDAGES/DRESSINGS) ×3 IMPLANT
SPONGE LAP 4X18 X RAY DECT (DISPOSABLE) IMPLANT
SUT CHROMIC 4 0 PS 2 18 (SUTURE) ×3 IMPLANT
SUT PROLENE 4 0 PS 2 18 (SUTURE) ×6 IMPLANT
SYR CONTROL 10ML LL (SYRINGE) IMPLANT
TOWEL OR 17X24 6PK STRL BLUE (TOWEL DISPOSABLE) ×3 IMPLANT
TOWEL OR 17X26 10 PK STRL BLUE (TOWEL DISPOSABLE) ×3 IMPLANT
TUBE ANAEROBIC SPECIMEN COL (MISCELLANEOUS) IMPLANT
TUBE CONNECTING 12'X1/4 (SUCTIONS) ×1
TUBE CONNECTING 12X1/4 (SUCTIONS) ×2 IMPLANT
WATER STERILE IRR 1000ML POUR (IV SOLUTION) ×3 IMPLANT
YANKAUER SUCT BULB TIP NO VENT (SUCTIONS) ×3 IMPLANT

## 2015-12-27 NOTE — Op Note (Signed)
See dictation#[id number] Amanda PeaGramig MD

## 2015-12-27 NOTE — Anesthesia Postprocedure Evaluation (Signed)
Anesthesia Post Note  Patient: Bobby Henderson  Procedure(s) Performed: Procedure(s) (LRB): IRRIGATION AND DEBRIDEMENT LEFT HAND WITH CLOSURE (Left)  Patient location during evaluation: PACU Anesthesia Type: General Level of consciousness: awake and alert, oriented and patient cooperative Pain management: pain level controlled Vital Signs Assessment: post-procedure vital signs reviewed and stable Respiratory status: spontaneous breathing, nonlabored ventilation and respiratory function stable Cardiovascular status: blood pressure returned to baseline and stable Postop Assessment: no signs of nausea or vomiting Anesthetic complications: no    Last Vitals:  Filed Vitals:   12/27/15 1000 12/27/15 1015  BP: 129/88 125/83  Pulse: 57 63  Temp: 36.7 C   Resp: 12 15    Last Pain:  Filed Vitals:   12/27/15 1017  PainSc: 8                  Yostin Malacara,E. Larsen Zettel

## 2015-12-27 NOTE — Anesthesia Preprocedure Evaluation (Signed)
Anesthesia Evaluation  Patient identified by MRN, date of birth, ID band Patient awake    Reviewed: Allergy & Precautions, NPO status , Patient's Chart, lab work & pertinent test results  History of Anesthesia Complications Negative for: history of anesthetic complications  Airway Mallampati: II  TM Distance: >3 FB Neck ROM: Full    Dental  (+) Dental Advisory Given, Teeth Intact   Pulmonary Current Smoker,    breath sounds clear to auscultation       Cardiovascular negative cardio ROS   Rhythm:Regular Rate:Normal     Neuro/Psych negative neurological ROS     GI/Hepatic negative GI ROS, (+)     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,   Endo/Other  negative endocrine ROS  Renal/GU negative Renal ROS     Musculoskeletal   Abdominal   Peds  Hematology negative hematology ROS (+)   Anesthesia Other Findings   Reproductive/Obstetrics                             Anesthesia Physical Anesthesia Plan  ASA: II  Anesthesia Plan: General   Post-op Pain Management:    Induction: Intravenous  Airway Management Planned: LMA  Additional Equipment:   Intra-op Plan:   Post-operative Plan:   Informed Consent: I have reviewed the patients History and Physical, chart, labs and discussed the procedure including the risks, benefits and alternatives for the proposed anesthesia with the patient or authorized representative who has indicated his/her understanding and acceptance.   Dental advisory given  Plan Discussed with: CRNA and Surgeon  Anesthesia Plan Comments: (Plan routine monitors, GA- LMA OK)        Anesthesia Quick Evaluation

## 2015-12-27 NOTE — Transfer of Care (Signed)
Immediate Anesthesia Transfer of Care Note  Patient: Bobby Henderson  Procedure(s) Performed: Procedure(s): IRRIGATION AND DEBRIDEMENT LEFT HAND WITH CLOSURE (Left)  Patient Location: PACU  Anesthesia Type:General  Level of Consciousness: awake, alert , oriented, sedated, patient cooperative and responds to stimulation  Airway & Oxygen Therapy: Patient Spontanous Breathing and Patient connected to nasal cannula oxygen  Post-op Assessment: Report given to RN, Post -op Vital signs reviewed and stable, Patient moving all extremities and Patient moving all extremities X 4  Post vital signs: Reviewed and stable  Last Vitals:  Filed Vitals:   12/27/15 0533 12/27/15 0930  BP: 108/66   Pulse: 64 67  Temp: 36.7 C 36.8 C  Resp: 16 10    Last Pain:  Filed Vitals:   12/27/15 0933  PainSc: 4       Patients Stated Pain Goal: 2 (12/27/15 0622)  Complications: No apparent anesthesia complications

## 2015-12-27 NOTE — Anesthesia Procedure Notes (Signed)
Procedure Name: LMA Insertion Date/Time: 12/27/2015 8:13 AM Performed by: Wray KearnsFOLEY, Bobby Henderson Pre-anesthesia Checklist: Patient identified, Emergency Drugs available, Suction available, Patient being monitored and Timeout performed Patient Re-evaluated:Patient Re-evaluated prior to inductionOxygen Delivery Method: Circle system utilized Preoxygenation: Pre-oxygenation with 100% oxygen Intubation Type: IV induction Ventilation: Mask ventilation without difficulty LMA: LMA inserted LMA Size: 4.0 Tube type: Oral Number of attempts: 1 Placement Confirmation: positive ETCO2 and breath sounds checked- equal and bilateral Tube secured with: Tape Dental Injury: Teeth and Oropharynx as per pre-operative assessment

## 2015-12-28 ENCOUNTER — Encounter (HOSPITAL_COMMUNITY): Payer: Self-pay | Admitting: Orthopedic Surgery

## 2015-12-28 LAB — WOUND CULTURE: Culture: NO GROWTH

## 2015-12-28 LAB — HIV ANTIBODY (ROUTINE TESTING W REFLEX): HIV Screen 4th Generation wRfx: NONREACTIVE

## 2015-12-28 LAB — VANCOMYCIN, TROUGH: VANCOMYCIN TR: 20 ug/mL (ref 10.0–20.0)
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
## 2015-12-29 ENCOUNTER — Encounter (HOSPITAL_COMMUNITY): Payer: Self-pay | Admitting: General Practice

## 2015-12-29 LAB — TISSUE CULTURE
Culture: NO GROWTH
GRAM STAIN: NONE SEEN

## 2015-12-29 LAB — HEPATITIS B SURFACE ANTIGEN: HEP B S AG: NEGATIVE

## 2015-12-29 LAB — HEPATITIS C ANTIBODY: HCV Ab: <0.1 s/co ratio (ref 0.0–0.9)

## 2015-12-29 MED ORDER — CIPROFLOXACIN HCL 500 MG PO TABS: 500.0000 mg | ORAL_TABLET | Freq: Two times a day (BID) | ORAL | Status: DC

## 2015-12-29 NOTE — Discharge Instructions (Signed)
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
## 2015-12-30 LAB — ANAEROBIC CULTURE

## 2015-12-30 NOTE — Progress Notes (Signed)
Reviewed discharge instructions with patient.  His friend is picking him up to go to [facility]. Answered all of his questions.  Odetta PinkMary R Lauris Keepers 12/30/2015 10:11 AM

## 2016-03-25 ENCOUNTER — Inpatient Hospital Stay (HOSPITAL_COMMUNITY)
Admission: EM | Admit: 2016-03-25 | Discharge: 2016-03-27 | DRG: 343 | Disposition: A | Discharge status: Home / Self Care | Payer: Self-pay | Attending: General Surgery | Admitting: General Surgery

## 2016-03-25 DIAGNOSIS — M1990 Unspecified osteoarthritis, unspecified site: Secondary | ICD-10-CM | POA: Diagnosis present

## 2016-03-25 DIAGNOSIS — K3580 Unspecified acute appendicitis: Principal | ICD-10-CM | POA: Diagnosis present

## 2016-03-25 DIAGNOSIS — F17210 Nicotine dependence, cigarettes, uncomplicated: Secondary | ICD-10-CM | POA: Diagnosis present

## 2016-03-25 LAB — CBC
HCT: 47.2 % (ref 39.0–52.0)
HEMOGLOBIN: 15.8 g/dL (ref 13.0–17.0)
MCH: 30.6 pg (ref 26.0–34.0)
MCHC: 33.5 g/dL (ref 30.0–36.0)
MCV: 91.3 fL (ref 78.0–100.0)
Platelets: 185 10*3/uL (ref 150–400)
RBC: 5.17 MIL/uL (ref 4.22–5.81)
RDW: 12.4 % (ref 11.5–15.5)
WBC: 12.8 10*3/uL — AB (ref 4.0–10.5)

## 2016-03-25 LAB — URINALYSIS, ROUTINE W REFLEX MICROSCOPIC
Bilirubin Urine: NEGATIVE
GLUCOSE, UA: NEGATIVE mg/dL
HGB URINE DIPSTICK: NEGATIVE
Ketones, ur: NEGATIVE mg/dL
LEUKOCYTES UA: NEGATIVE
Nitrite: NEGATIVE
Protein, ur: NEGATIVE mg/dL
SPECIFIC GRAVITY, URINE: 1.021 (ref 1.005–1.030)
pH: 7 (ref 5.0–8.0)

## 2016-03-25 LAB — LIPASE, BLOOD: Lipase: 19 U/L (ref 11–51)

## 2016-03-25 MED ORDER — ONDANSETRON 4 MG PO TBDP
4.0000 mg | ORAL_TABLET | Freq: Once | ORAL | Status: AC | PRN
  Administered 2016-03-25: 4 mg via ORAL

## 2016-03-25 MED ORDER — ONDANSETRON 4 MG PO TBDP
ORAL_TABLET | ORAL | Status: AC
  Filled 2016-03-25: qty 1

## 2016-03-25 NOTE — ED Notes (Signed)
Lab called- need more blood to run C Met

## 2016-03-25 NOTE — ED Triage Notes (Addendum)
Pt reports RLQ abdominal pain since last night, denies emesis, reports some nausea, denies problems urinating or having BMs.

## 2016-03-26 ENCOUNTER — Inpatient Hospital Stay (HOSPITAL_COMMUNITY): Payer: Self-pay | Admitting: Anesthesiology

## 2016-03-26 ENCOUNTER — Emergency Department (HOSPITAL_COMMUNITY): Payer: Self-pay

## 2016-03-26 ENCOUNTER — Encounter (HOSPITAL_COMMUNITY): Payer: Self-pay | Admitting: Certified Registered Nurse Anesthetist

## 2016-03-26 ENCOUNTER — Encounter (HOSPITAL_COMMUNITY): Admission: EM | Disposition: A | Discharge status: Home / Self Care | Payer: Self-pay | Source: Home / Self Care

## 2016-03-26 DIAGNOSIS — K3580 Unspecified acute appendicitis: Secondary | ICD-10-CM | POA: Diagnosis present

## 2016-03-26 HISTORY — PX: LAPAROSCOPIC APPENDECTOMY: SHX408

## 2016-03-26 LAB — COMPREHENSIVE METABOLIC PANEL
ALBUMIN: 4.6 g/dL (ref 3.5–5.0)
ALK PHOS: 98 U/L (ref 38–126)
ALT: 19 U/L (ref 17–63)
AST: 20 U/L (ref 15–41)
Anion gap: 8 (ref 5–15)
BUN: 13 mg/dL (ref 6–20)
CALCIUM: 9.5 mg/dL (ref 8.9–10.3)
CO2: 29 mmol/L (ref 22–32)
CREATININE: 1.15 mg/dL (ref 0.61–1.24)
Chloride: 101 mmol/L (ref 101–111)
GFR calc Af Amer: >60 mL/min (ref >60)
GFR calc non Af Amer: >60 mL/min (ref >60)
GLUCOSE: 103 mg/dL — AB (ref 65–99)
Potassium: 3.9 mmol/L (ref 3.5–5.1)
SODIUM: 138 mmol/L (ref 135–145)
Total Bilirubin: 0.5 mg/dL (ref 0.3–1.2)
Total Protein: 7.2 g/dL (ref 6.5–8.1)

## 2016-03-26 LAB — CBC
HCT: 43.5 % (ref 39.0–52.0)
Hemoglobin: 14.6 g/dL (ref 13.0–17.0)
MCH: 31.1 pg (ref 26.0–34.0)
MCHC: 33.6 g/dL (ref 30.0–36.0)
MCV: 92.6 fL (ref 78.0–100.0)
PLATELETS: 171 10*3/uL (ref 150–400)
RBC: 4.7 MIL/uL (ref 4.22–5.81)
RDW: 12.5 % (ref 11.5–15.5)
WBC: 11.9 10*3/uL — AB (ref 4.0–10.5)

## 2016-03-26 SURGERY — APPENDECTOMY, LAPAROSCOPIC
Anesthesia: General | Site: Abdomen

## 2016-03-26 MED ORDER — FENTANYL CITRATE (PF) 250 MCG/5ML IJ SOLN
INTRAMUSCULAR | Status: AC
Start: 2016-03-26 — End: 2016-03-26
  Filled 2016-03-26: qty 5

## 2016-03-26 MED ORDER — TRAMADOL HCL 50 MG PO TABS
ORAL_TABLET | ORAL | Status: AC
  Filled 2016-03-26: qty 2

## 2016-03-26 MED ORDER — FENTANYL CITRATE (PF) 100 MCG/2ML IJ SOLN
25.0000 ug | INTRAMUSCULAR | Status: DC | PRN
  Administered 2016-03-26 (×3): 50 ug via INTRAVENOUS

## 2016-03-26 MED ORDER — ONDANSETRON 4 MG PO TBDP
4.0000 mg | ORAL_TABLET | Freq: Four times a day (QID) | ORAL | Status: DC | PRN
  Filled 2016-03-26: qty 1

## 2016-03-26 MED ORDER — SUGAMMADEX SODIUM 200 MG/2ML IV SOLN
INTRAVENOUS | Status: DC | PRN
  Administered 2016-03-26: 200 mg via INTRAVENOUS

## 2016-03-26 MED ORDER — MORPHINE SULFATE (PF) 2 MG/ML IV SOLN: 1.0000 mg | INTRAVENOUS | Status: DC | PRN

## 2016-03-26 MED ORDER — ROCURONIUM BROMIDE 100 MG/10ML IV SOLN
INTRAVENOUS | Status: DC | PRN
  Administered 2016-03-26: 50 mg via INTRAVENOUS

## 2016-03-26 MED ORDER — KETOROLAC TROMETHAMINE 30 MG/ML IJ SOLN
30.0000 mg | Freq: Once | INTRAMUSCULAR | Status: AC | PRN
  Administered 2016-03-26: 30 mg via INTRAVENOUS

## 2016-03-26 MED ORDER — MIDAZOLAM HCL 5 MG/5ML IJ SOLN
INTRAMUSCULAR | Status: DC | PRN
  Administered 2016-03-26: 2 mg via INTRAVENOUS

## 2016-03-26 MED ORDER — TRAMADOL HCL 50 MG PO TABS
50.0000 mg | ORAL_TABLET | Freq: Four times a day (QID) | ORAL | Status: DC | PRN
  Administered 2016-03-26: 100 mg via ORAL

## 2016-03-26 MED ORDER — PROPOFOL 10 MG/ML IV BOLUS
INTRAVENOUS | Status: AC
  Filled 2016-03-26: qty 20

## 2016-03-26 MED ORDER — ENOXAPARIN SODIUM 40 MG/0.4ML ~~LOC~~ SOLN
40.0000 mg | Freq: Every day | SUBCUTANEOUS | Status: DC
  Administered 2016-03-26 – 2016-03-27 (×2): 40 mg via SUBCUTANEOUS
  Filled 2016-03-26 (×2): qty 0.4

## 2016-03-26 MED ORDER — LACTATED RINGERS IV SOLN
INTRAVENOUS | Status: DC
  Administered 2016-03-26 (×2): via INTRAVENOUS

## 2016-03-26 MED ORDER — METRONIDAZOLE IN NACL 5-0.79 MG/ML-% IV SOLN
500.0000 mg | Freq: Once | INTRAVENOUS | Status: AC
  Administered 2016-03-26: 500 mg via INTRAVENOUS
  Filled 2016-03-26: qty 100

## 2016-03-26 MED ORDER — KETOROLAC TROMETHAMINE 30 MG/ML IJ SOLN
30.0000 mg | Freq: Four times a day (QID) | INTRAMUSCULAR | Status: DC | PRN
Start: 2016-03-26 — End: 2016-03-26

## 2016-03-26 MED ORDER — SUCCINYLCHOLINE CHLORIDE 20 MG/ML IJ SOLN
INTRAMUSCULAR | Status: DC | PRN
  Administered 2016-03-26: 100 mg via INTRAVENOUS

## 2016-03-26 MED ORDER — ACETAMINOPHEN 325 MG PO TABS: 650.0000 mg | ORAL_TABLET | Freq: Four times a day (QID) | ORAL | Status: DC | PRN

## 2016-03-26 MED ORDER — PHENYLEPHRINE HCL 10 MG/ML IJ SOLN
INTRAMUSCULAR | Status: DC | PRN
  Administered 2016-03-26: 80 ug via INTRAVENOUS

## 2016-03-26 MED ORDER — LIDOCAINE HCL (CARDIAC) 20 MG/ML IV SOLN
INTRAVENOUS | Status: DC | PRN
  Administered 2016-03-26: 50 mg via INTRAVENOUS

## 2016-03-26 MED ORDER — MIDAZOLAM HCL 2 MG/2ML IJ SOLN
INTRAMUSCULAR | Status: AC
  Filled 2016-03-26: qty 2

## 2016-03-26 MED ORDER — BUPIVACAINE-EPINEPHRINE (PF) 0.25% -1:200000 IJ SOLN
INTRAMUSCULAR | Status: AC
  Filled 2016-03-26: qty 30

## 2016-03-26 MED ORDER — FENTANYL CITRATE (PF) 100 MCG/2ML IJ SOLN
INTRAMUSCULAR | Status: AC
  Filled 2016-03-26: qty 2

## 2016-03-26 MED ORDER — FENTANYL CITRATE (PF) 250 MCG/5ML IJ SOLN
INTRAMUSCULAR | Status: AC
  Filled 2016-03-26: qty 5

## 2016-03-26 MED ORDER — ONDANSETRON HCL 4 MG/2ML IJ SOLN: 4.0000 mg | Freq: Four times a day (QID) | INTRAMUSCULAR | Status: DC | PRN

## 2016-03-26 MED ORDER — DEXAMETHASONE SODIUM PHOSPHATE 10 MG/ML IJ SOLN
INTRAMUSCULAR | Status: DC | PRN
  Administered 2016-03-26: 10 mg via INTRAVENOUS

## 2016-03-26 MED ORDER — ONDANSETRON HCL 4 MG/2ML IJ SOLN
INTRAMUSCULAR | Status: DC | PRN
  Administered 2016-03-26: 4 mg via INTRAVENOUS

## 2016-03-26 MED ORDER — PROMETHAZINE HCL 25 MG/ML IJ SOLN: 6.2500 mg | INTRAMUSCULAR | Status: DC | PRN

## 2016-03-26 MED ORDER — BUPIVACAINE-EPINEPHRINE 0.25% -1:200000 IJ SOLN
INTRAMUSCULAR | Status: DC | PRN
  Administered 2016-03-26: 10 mL

## 2016-03-26 MED ORDER — SODIUM CHLORIDE 0.9 % IR SOLN
Status: DC | PRN
  Administered 2016-03-26: 1000 mL

## 2016-03-26 MED ORDER — IOPAMIDOL (ISOVUE-300) INJECTION 61%
INTRAVENOUS | Status: AC
  Administered 2016-03-26: 100 mL
  Filled 2016-03-26: qty 100

## 2016-03-26 MED ORDER — 0.9 % SODIUM CHLORIDE (POUR BTL) OPTIME
TOPICAL | Status: DC | PRN
  Administered 2016-03-26: 1000 mL

## 2016-03-26 MED ORDER — PROPOFOL 10 MG/ML IV BOLUS
INTRAVENOUS | Status: DC | PRN
  Administered 2016-03-26: 150 mg via INTRAVENOUS

## 2016-03-26 MED ORDER — KCL IN DEXTROSE-NACL 20-5-0.9 MEQ/L-%-% IV SOLN
INTRAVENOUS | Status: DC
  Filled 2016-03-26: qty 1000

## 2016-03-26 MED ORDER — KCL IN DEXTROSE-NACL 20-5-0.9 MEQ/L-%-% IV SOLN
INTRAVENOUS | Status: DC
  Administered 2016-03-26 (×2): via INTRAVENOUS
  Filled 2016-03-26 (×2): qty 1000

## 2016-03-26 MED ORDER — SODIUM CHLORIDE 0.9 % IV BOLUS (SEPSIS)
1000.0000 mL | Freq: Once | INTRAVENOUS | Status: AC
  Administered 2016-03-26: 1000 mL via INTRAVENOUS

## 2016-03-26 MED ORDER — KETOROLAC TROMETHAMINE 30 MG/ML IJ SOLN
INTRAMUSCULAR | Status: AC
  Filled 2016-03-26: qty 1

## 2016-03-26 MED ORDER — DEXTROSE 5 % IV SOLN
2.0000 g | Freq: Once | INTRAVENOUS | Status: AC
  Administered 2016-03-26: 2 g via INTRAVENOUS
  Filled 2016-03-26: qty 2

## 2016-03-26 MED ORDER — FENTANYL CITRATE (PF) 100 MCG/2ML IJ SOLN
INTRAMUSCULAR | Status: DC | PRN
  Administered 2016-03-26 (×2): 100 ug via INTRAVENOUS
  Administered 2016-03-26 (×4): 50 ug via INTRAVENOUS
  Administered 2016-03-26: 100 ug via INTRAVENOUS

## 2016-03-26 MED ORDER — KETOROLAC TROMETHAMINE 30 MG/ML IJ SOLN
15.0000 mg | Freq: Four times a day (QID) | INTRAMUSCULAR | Status: DC
Start: 2016-03-26 — End: 2016-03-27
  Administered 2016-03-26 – 2016-03-27 (×2): 15 mg via INTRAVENOUS
  Filled 2016-03-26 (×2): qty 1

## 2016-03-26 SURGICAL SUPPLY — 41 items
APPLIER CLIP ROT 10 11.4 M/L (STAPLE)
BLADE SURG ROTATE 9660 (MISCELLANEOUS) ×3 IMPLANT
CANISTER SUCTION 2500CC (MISCELLANEOUS) ×3 IMPLANT
CHLORAPREP W/TINT 26ML (MISCELLANEOUS) ×3 IMPLANT
CLIP APPLIE ROT 10 11.4 M/L (STAPLE) IMPLANT
CLOSURE WOUND 1/2 X4 (GAUZE/BANDAGES/DRESSINGS) ×1
COVER SURGICAL LIGHT HANDLE (MISCELLANEOUS) ×3 IMPLANT
CUTTER FLEX LINEAR 45M (STAPLE) ×3 IMPLANT
DERMABOND ADVANCED (GAUZE/BANDAGES/DRESSINGS) ×2
DERMABOND ADVANCED .7 DNX12 (GAUZE/BANDAGES/DRESSINGS) ×1 IMPLANT
DRSG TEGADERM 2-3/8X2-3/4 SM (GAUZE/BANDAGES/DRESSINGS) ×9 IMPLANT
DRSG TEGADERM 4X4.75 (GAUZE/BANDAGES/DRESSINGS) ×3 IMPLANT
ELECT REM PT RETURN 9FT ADLT (ELECTROSURGICAL) ×3
ELECTRODE REM PT RTRN 9FT ADLT (ELECTROSURGICAL) ×1 IMPLANT
ENDOLOOP SUT PDS II  0 18 (SUTURE)
ENDOLOOP SUT PDS II 0 18 (SUTURE) IMPLANT
GLOVE BIOGEL PI IND STRL 8 (GLOVE) ×1 IMPLANT
GLOVE BIOGEL PI INDICATOR 8 (GLOVE) ×2
GLOVE ECLIPSE 7.5 STRL STRAW (GLOVE) ×3 IMPLANT
GOWN STRL REUS W/ TWL LRG LVL3 (GOWN DISPOSABLE) ×3 IMPLANT
GOWN STRL REUS W/TWL LRG LVL3 (GOWN DISPOSABLE) ×6
KIT BASIN OR (CUSTOM PROCEDURE TRAY) ×3 IMPLANT
KIT ROOM TURNOVER OR (KITS) ×3 IMPLANT
NS IRRIG 1000ML POUR BTL (IV SOLUTION) ×3 IMPLANT
PAD ARMBOARD 7.5X6 YLW CONV (MISCELLANEOUS) ×6 IMPLANT
POUCH SPECIMEN RETRIEVAL 10MM (ENDOMECHANICALS) ×3 IMPLANT
RELOAD 45 VASCULAR/THIN (ENDOMECHANICALS) IMPLANT
RELOAD STAPLE TA45 3.5 REG BLU (ENDOMECHANICALS) ×3 IMPLANT
SCALPEL HARMONIC ACE (MISCELLANEOUS) ×3 IMPLANT
SET IRRIG TUBING LAPAROSCOPIC (IRRIGATION / IRRIGATOR) ×3 IMPLANT
SLEEVE ENDOPATH XCEL 5M (ENDOMECHANICALS) ×3 IMPLANT
SPECIMEN JAR SMALL (MISCELLANEOUS) ×3 IMPLANT
STRIP CLOSURE SKIN 1/2X4 (GAUZE/BANDAGES/DRESSINGS) ×2 IMPLANT
SUT MNCRL AB 4-0 PS2 18 (SUTURE) ×3 IMPLANT
TOWEL OR 17X24 6PK STRL BLUE (TOWEL DISPOSABLE) ×3 IMPLANT
TOWEL OR 17X26 10 PK STRL BLUE (TOWEL DISPOSABLE) ×3 IMPLANT
TRAY FOLEY CATH 16FR SILVER (SET/KITS/TRAYS/PACK) ×3 IMPLANT
TRAY LAPAROSCOPIC MC (CUSTOM PROCEDURE TRAY) ×3 IMPLANT
TROCAR XCEL BLUNT TIP 100MML (ENDOMECHANICALS) ×3 IMPLANT
TROCAR XCEL NON-BLD 5MMX100MML (ENDOMECHANICALS) ×3 IMPLANT
TUBING INSUFFLATION (TUBING) ×3 IMPLANT

## 2016-03-26 NOTE — ED Notes (Signed)
Surgeon at bedside.  

## 2016-03-26 NOTE — Transfer of Care (Signed)
Immediate Anesthesia Transfer of Care Note  Patient: Bobby Henderson  Procedure(s) Performed: Procedure(s): APPENDECTOMY LAPAROSCOPIC (N/A)  Patient Location: PACU  Anesthesia Type:General  Level of Consciousness: awake, alert , oriented and patient cooperative  Airway & Oxygen Therapy: Patient Spontanous Breathing and Patient connected to nasal cannula oxygen  Post-op Assessment: Report given to RN and Post -op Vital signs reviewed and stable  Post vital signs: Reviewed and stable  Last Vitals:  Vitals:   03/26/16 0840 03/26/16 1114  BP: 124/86   Pulse: 76   Resp: 16 18  Temp: 36.7 C 36.4 C    Last Pain:  Vitals:   03/26/16 0840  TempSrc: Oral  PainSc:          Complications: No apparent anesthesia complications

## 2016-03-26 NOTE — H&P (Signed)
Bobby Henderson is an 30 y.o. male.   Chief Complaint: Abdominal pain HPI: Asked to see patient at request of Dr. Venora Maples for abdominal pain for 1 day. Patient states he has had right lower quadrant abdominal pain for 1 day. No nausea or vomiting. Patient is in a rehabilitation center. He has a history of pain medicine addiction. He states the pain is severe location right lower quadrant of abdomen without nausea or vomiting. CT shows acute appendicitis.  Past Medical History:  Diagnosis Date  . Arthritis   . HNP (herniated nucleus pulposus), lumbar    pt. states not a problem now- told that the level is L5  . Hypoglycemia     Past Surgical History:  Procedure Laterality Date  . I&D EXTREMITY Left 12/25/2015   Procedure: IRRIGATION AND DEBRIDEMENT EXTREMITY;  Surgeon: Roseanne Kaufman, MD;  Location: [facility];  Service: Orthopedics;  Laterality: Left;  . I&D EXTREMITY Left 12/27/2015   Procedure: IRRIGATION AND DEBRIDEMENT LEFT HAND WITH CLOSURE;  Surgeon: Roseanne Kaufman, MD;  Location: [facility];  Service: Orthopedics;  Laterality: Left;    No family history on file. Social History:  reports that he has been smoking Cigarettes.  He has a 5.00 pack-year smoking history. He has never used smokeless tobacco. He reports that he uses drugs, including Marijuana and Heroin. He reports that he does not drink alcohol.  Allergies:  Allergies  Allergen Reactions  . Guaifenesin & Derivatives Nausea And Vomiting  . Sulfa Antibiotics Other (See Comments)    unk  . Codeine Rash     (Not in a hospital admission)  Results for orders placed or performed during the hospital encounter of 03/25/16 (from the past 48 hour(s))  Lipase, blood     Status: None   Collection Time: 03/25/16 10:55 PM  Result Value Ref Range   Lipase 19 11 - 51 U/L  CBC     Status: Abnormal   Collection Time: 03/25/16 10:55 PM  Result Value Ref Range   WBC 12.8 (H) 4.0 - 10.5 K/uL   RBC 5.17 4.22 - 5.81 MIL/uL   Hemoglobin 15.8 13.0  - 17.0 g/dL   HCT 47.2 39.0 - 52.0 %   MCV 91.3 78.0 - 100.0 fL   MCH 30.6 26.0 - 34.0 pg   MCHC 33.5 30.0 - 36.0 g/dL   RDW 12.4 11.5 - 15.5 %   Platelets 185 150 - 400 K/uL  Urinalysis, Routine w reflex microscopic     Status: Abnormal   Collection Time: 03/25/16 11:25 PM  Result Value Ref Range   Color, Urine YELLOW YELLOW   APPearance CLOUDY (A) CLEAR   Specific Gravity, Urine 1.021 1.005 - 1.030   pH 7.0 5.0 - 8.0   Glucose, UA NEGATIVE NEGATIVE mg/dL   Hgb urine dipstick NEGATIVE NEGATIVE   Bilirubin Urine NEGATIVE NEGATIVE   Ketones, ur NEGATIVE NEGATIVE mg/dL   Protein, ur NEGATIVE NEGATIVE mg/dL   Nitrite NEGATIVE NEGATIVE   Leukocytes, UA NEGATIVE NEGATIVE    Comment: MICROSCOPIC NOT DONE ON URINES WITH NEGATIVE PROTEIN, BLOOD, LEUKOCYTES, NITRITE, OR GLUCOSE <1000 mg/dL.  Comprehensive metabolic panel     Status: Abnormal   Collection Time: 03/25/16 11:45 PM  Result Value Ref Range   Sodium 138 135 - 145 mmol/L   Potassium 3.9 3.5 - 5.1 mmol/L   Chloride 101 101 - 111 mmol/L   CO2 29 22 - 32 mmol/L   Glucose, Bld 103 (H) 65 - 99 mg/dL   BUN  13 6 - 20 mg/dL   Creatinine, Ser 1.15 0.61 - 1.24 mg/dL   Calcium 9.5 8.9 - 10.3 mg/dL   Total Protein 7.2 6.5 - 8.1 g/dL   Albumin 4.6 3.5 - 5.0 g/dL   AST 20 15 - 41 U/L   ALT 19 17 - 63 U/L   Alkaline Phosphatase 98 38 - 126 U/L   Total Bilirubin 0.5 0.3 - 1.2 mg/dL   GFR calc non Af Amer >60 >60 mL/min   GFR calc Af Amer >60 >60 mL/min    Comment: (NOTE) The eGFR has been calculated using the CKD EPI equation. This calculation has not been validated in all clinical situations. eGFR's persistently <60 mL/min signify possible Chronic Kidney Disease.    Anion gap 8 5 - 15   Ct Abdomen Pelvis W Contrast  Result Date: 03/26/2016 CLINICAL DATA:  RIGHT lower quadrant pain, leukocytosis. EXAM: CT ABDOMEN AND PELVIS WITH CONTRAST TECHNIQUE: Multidetector CT imaging of the abdomen and pelvis was performed using the standard  protocol following bolus administration of intravenous contrast. CONTRAST:  182m ISOVUE-300 IOPAMIDOL (ISOVUE-300) INJECTION 61% COMPARISON:  None. FINDINGS: LUNG BASES: Included view of the lung bases are clear. Visualized heart and pericardium are unremarkable. SOLID ORGANS: The liver, spleen, gallbladder, pancreas and adrenal glands are unremarkable. GASTROINTESTINAL TRACT: The stomach, small and large bowel are normal in course and caliber without inflammatory changes. Appendix is enlarged, 11 mm, hyperemic with periappendiceal inflammation. KIDNEYS/ URINARY TRACT: Kidneys are orthotopic, demonstrating symmetric enhancement. No nephrolithiasis, hydronephrosis or solid renal masses. 10 mm simple cyst upper pole LEFT kidney. The unopacified ureters are normal in course and caliber. Urinary bladder is partially distended and unremarkable. PERITONEUM/RETROPERITONEUM: Aortoiliac vessels are normal in course and caliber. No lymphadenopathy by CT size criteria. Prostate size is normal. No intraperitoneal free air. Trace free fluid RIGHT pericolic gutter. No abscess. SOFT TISSUE/OSSEOUS STRUCTURES: Non-suspicious. Small fat containing umbilical hernia. Small L5-S1 broad-based disc osteophyte complex. Small fat containing umbilical hernia. IMPRESSION: Acute uncomplicated appendicitis. Acute findings discussed with and reconfirmed by HAbigail Buttson 03/26/2016 at 3:33 am. Electronically Signed   By: CElon AlasM.D.   On: 03/26/2016 03:33    Review of Systems  Constitutional: Negative for chills and fever.  HENT: Negative.   Eyes: Negative.   Respiratory: Negative.   Cardiovascular: Negative.   Gastrointestinal: Positive for abdominal pain.  Genitourinary: Negative.   Musculoskeletal: Negative.   Skin: Negative.   Neurological: Negative.   Psychiatric/Behavioral: Positive for substance abuse.    Blood pressure 122/74, pulse 91, temperature 99.2 F (37.3 C), temperature source Oral, resp. rate  16, height 6' (1.829 m), weight 71.3 kg (157 lb 4 oz), SpO2 99 %. Physical Exam  Constitutional: He is oriented to person, place, and time. He appears well-developed.  HENT:  Head: Normocephalic and atraumatic.  Eyes: Pupils are equal, round, and reactive to light.  Neck: Normal range of motion. Neck supple.  Cardiovascular: Normal rate.   Respiratory: Effort normal.  GI: There is tenderness in the right lower quadrant. There is guarding and tenderness at McBurney's point. There is no rigidity.  Musculoskeletal: Normal range of motion.  Neurological: He is alert and oriented to person, place, and time.  Skin: Skin is warm and dry.  Psychiatric: He has a normal mood and affect. His behavior is normal. Thought content normal.     Assessment/Plan Acute appendicitis  Admit  Laparoscopic appendectomy later on this morning by Dr. WHulen Skains Pathophysiology and treatment options of  appendicitis discussed. Surgical and medical options discussed. Patient agrees to proceed. Patient like to limit his pain medication intake due to being in a program of rehabilitation secondary to substance abuse. The procedure has been discussed with the patient.  Alternative therapies have been discussed with the patient.  Operative risks include bleeding,  Infection,  Organ injury,  Nerve injury,  Blood vessel injury,  DVT,  Pulmonary embolism,  Death,  And possible reoperation.  Medical management risks include worsening of present situation.  The success of the procedure is 50 -90 % at treating patients symptoms.  The patient understands and agrees to proceed.  Cesario Weidinger A., MD 03/26/2016, 4:07 AM

## 2016-03-26 NOTE — Brief Op Note (Signed)
03/26/2016  11:00 AM  PATIENT:  Bobby Henderson  30 y.o. male  PRE-OPERATIVE DIAGNOSIS:  appendicitis  POST-OPERATIVE DIAGNOSIS:  appendicitis  PROCEDURE:  Procedure(s): APPENDECTOMY LAPAROSCOPIC (N/A)  SURGEON:  Surgeon(s) and Role:    * Jimmye Norman, MD - Primary  PHYSICIAN ASSISTANT:   ASSISTANTS: none   ANESTHESIA:   general  EBL:  Total I/O In: 1000 [I.V.:1000] Out: -   BLOOD ADMINISTERED:none  DRAINS: none   LOCAL MEDICATIONS USED:  MARCAINE     SPECIMEN:  Source of Specimen:  Appendix  DISPOSITION OF SPECIMEN:  PATHOLOGY  COUNTS:  YES  TOURNIQUET:  * No tourniquets in log *  DICTATION: .Dragon Dictation  PLAN OF CARE: Admit for overnight observation  PATIENT DISPOSITION:  PACU - hemodynamically stable.   Delay start of Pharmacological VTE agent (>24hrs) due to surgical blood loss or risk of bleeding: no

## 2016-03-26 NOTE — Anesthesia Postprocedure Evaluation (Signed)
Anesthesia Post Note  Patient: Bobby Henderson  Procedure(s) Performed: Procedure(s) (LRB): APPENDECTOMY LAPAROSCOPIC (N/A)  Patient location during evaluation: PACU Anesthesia Type: General Level of consciousness: awake and alert Pain management: pain level controlled Vital Signs Assessment: post-procedure vital signs reviewed and stable Respiratory status: spontaneous breathing, nonlabored ventilation, respiratory function stable and patient connected to nasal cannula oxygen Cardiovascular status: blood pressure returned to baseline and stable Postop Assessment: no signs of nausea or vomiting Anesthetic complications: no    Last Vitals:  Vitals:   03/26/16 1115 03/26/16 1130  BP: 108/75 122/77  Pulse: 95 75  Resp: 13 10  Temp:      Last Pain:  Vitals:   03/26/16 1130  TempSrc:   PainSc: 6                  Kennieth Rad

## 2016-03-26 NOTE — Anesthesia Procedure Notes (Signed)
Procedure Name: Intubation Date/Time: 03/26/2016 10:08 AM Performed by: Adonis Housekeeper Pre-anesthesia Checklist: Patient identified, Emergency Drugs available, Suction available and Patient being monitored Patient Re-evaluated:Patient Re-evaluated prior to inductionOxygen Delivery Method: Circle system utilized Preoxygenation: Pre-oxygenation with 100% oxygen Intubation Type: IV induction and Rapid sequence Laryngoscope Size: Mac and 3 Grade View: Grade I Tube type: Oral Tube size: 7.5 mm Number of attempts: 1 Airway Equipment and Method: Stylet Placement Confirmation: ETT inserted through vocal cords under direct vision,  positive ETCO2 and breath sounds checked- equal and bilateral Secured at: 21 cm Tube secured with: Tape Dental Injury: Teeth and Oropharynx as per pre-operative assessment

## 2016-03-26 NOTE — Anesthesia Preprocedure Evaluation (Addendum)
Anesthesia Evaluation  Patient identified by MRN, date of birth, ID band Patient awake    Reviewed: Allergy & Precautions, NPO status , Patient's Chart, lab work & pertinent test results  Airway Mallampati: II  TM Distance: >3 FB Neck ROM: Full    Dental   Pulmonary Current Smoker,    breath sounds clear to auscultation       Cardiovascular negative cardio ROS   Rhythm:Regular Rate:Normal     Neuro/Psych negative neurological ROS     GI/Hepatic (+)     substance abuse  , Acute appendicitis   Endo/Other  negative endocrine ROS  Renal/GU negative Renal ROS     Musculoskeletal  (+) Arthritis ,   Abdominal   Peds  Hematology negative hematology ROS (+)   Anesthesia Other Findings   Reproductive/Obstetrics                            Lab Results  Component Value Date   WBC 11.9 (H) 03/26/2016   HGB 14.6 03/26/2016   HCT 43.5 03/26/2016   MCV 92.6 03/26/2016   PLT 171 03/26/2016   Lab Results  Component Value Date   CREATININE 1.15 03/25/2016   BUN 13 03/25/2016   NA 138 03/25/2016   K 3.9 03/25/2016   CL 101 03/25/2016   CO2 29 03/25/2016    Anesthesia Physical Anesthesia Plan  ASA: II  Anesthesia Plan: General   Post-op Pain Management:    Induction: Intravenous  Airway Management Planned: Oral ETT  Additional Equipment:   Intra-op Plan:   Post-operative Plan: Extubation in OR  Informed Consent: I have reviewed the patients History and Physical, chart, labs and discussed the procedure including the risks, benefits and alternatives for the proposed anesthesia with the patient or authorized representative who has indicated his/her understanding and acceptance.   Dental advisory given  Plan Discussed with: CRNA  Anesthesia Plan Comments:        Anesthesia Quick Evaluation
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
## 2016-03-26 NOTE — Op Note (Signed)
OPERATIVE REPORT  DATE OF OPERATION:  03/26/2016  PATIENT:  Jossie Ng  30 y.o. male  PRE-OPERATIVE DIAGNOSIS:  appendicitis  POST-OPERATIVE DIAGNOSIS:  appendicitis  FINDINGS:  Acutely inflamed, non-perforated appendix  PROCEDURE:  Procedure(s): APPENDECTOMY LAPAROSCOPIC  SURGEON:  Surgeon(s): Jimmye Norman, MD  ASSISTANT: None  ANESTHESIA:   general  COMPLICATIONS:  None  EBL: <10 ml  BLOOD ADMINISTERED: none  DRAINS: none   SPECIMEN:  Source of Specimen:  Appendix  COUNTS CORRECT:  YES  PROCEDURE DETAILS: The patient was taken to the operating room and placed on the table in supine position. After an adequate general endotracheal anesthetic was administered he was prepped and draped in usual sterile manner exposing his entire abdomen.  A proper timeout was performed identifying the patient and procedure to be performed. A supraumbilical midline incision was made using a #15 blade and taken down to the midline fascia. We incised the midline fascia and then subsequently bluntly dissected down into the peritoneal cavity. A pursestring suture of 0 Vicryl was passed around the fascial opening which secured in place a Hassan cannula which was used to insufflate carbon dioxide gas up to a maximal intra-abdominal pressure of 15 mmHg.  The patient was placed in Trendelenburg position the left side was tilted down. A right upper quadrant 5 mm cannula and a left low quadrant 5 mm cannula was passed under direct vision.  With the attached camera and light source we were able to inspect and dissect out the appendix which was partially on the lateral aspect and adhered to the cecal wall. We were able to dissected free and then subsequently dissected away from the surrounding structures using a Harmonic Scalpel to not only take down the inflammatory adhesions but also the mesoappendix.  Once this was done and it was skeletonized an an endoscopic GIA stapler was used to come across the  base of the appendix at the cecum and retrieved the appendix using an Endo Catch bag from the supraumbilical site.  Once the appendix was retrieved, we irrigated in the area of the appendix and inspected for bleeding in that was not. We placed the patient in the neutral position the subsequently aspirated all fluid and gas from the perineal cavity.  The pursestring suture was used to close out the fascial incision at the super umbilical site. Once this was done IS were removed. We injected all incisions with 0.25% Marcaine with epinephrine. We then closed the supraumbilical skin site using a running subcuticular stitch of 4-0 Monocryl. Dermabond Steri-Strips and Tegaderms are used to complete our dressings. All needle counts, sponge counts, and instrument counts were correct.  PATIENT DISPOSITION:  PACU - hemodynamically stable.   Sereena Marando 8/5/201711:01 AM

## 2016-03-27 NOTE — Discharge Summary (Signed)
Physician Discharge Summary  Patient ID: Bobby Henderson M Sethi MRN: [id number] DOB/AGE: [birth year] 30 y.o.  Admit date: 03/25/2016 Discharge date: 03/27/2016  Admission Diagnoses: Appendicitis  Discharge Diagnoses:  Active Problems:   Acute appendicitis   Discharged Condition: good  Hospital Course: [age] yom with appendicitis underwent uncomplicated lap appy, doing well following am  Consults: None  Significant Diagnostic Studies: none  Treatments: surgery: lap appy  Discharge Exam: Blood pressure 91/67, pulse 74, temperature 97.9 F (36.6 C), temperature source Oral, resp. rate 16, height 6' (1.829 m), weight 71.3 kg (157 lb 4 oz), SpO2 97 %. GI: incisions clean soft   Disposition: 01-Home or Self Care     Medication List    TAKE these medications   acetaminophen 500 MG tablet Commonly known as:  TYLENOL Take 1,000 mg by mouth every 6 (six) hours as needed for mild pain.   ibuprofen 200 MG tablet Commonly known as:  ADVIL,MOTRIN Take 600 mg by mouth every 8 (eight) hours as needed for moderate pain.   loratadine 10 MG tablet Commonly known as:  CLARITIN Take 10 mg by mouth daily as needed for allergies.   multivitamin with minerals Tabs tablet Take 1 tablet by mouth daily.   PROBIOTIC PO Take 1 tablet by mouth daily.      Follow-up Information    [facility] [place] Schedule an appointment as soon as possible for a visit in 3 week(s).   Specialty:  General Surgery Contact information: [address] [place] [id number] [phone]          Signed: Emelia LoronWAKEFIELD,Tinslee Klare 03/27/2016, 10:16 AM

## 2016-03-27 NOTE — Progress Notes (Signed)
Discharge instructions provided to patient.  No prescription at time of discharge for pain medication, patient indicates he is not able to take opioids/narcotics at this time.    No questions or complaints of pain at time of discharge.  IV removed.  Parents called for ride home.

## 2016-03-27 NOTE — Discharge Instructions (Signed)
[facility] LAPAROSCOPIC SURGERY: POST OP INSTRUCTIONS  Always review your discharge instruction sheet given to you by the facility where your surgery was performed. IF YOU HAVE DISABILITY OR FAMILY LEAVE FORMS, YOU MUST BRING THEM TO THE OFFICE FOR PROCESSING.   DO NOT GIVE THEM TO YOUR DOCTOR.  1. A prescription for pain medication may be given to you upon discharge.  Take your pain medication as prescribed, if needed.  If narcotic pain medicine is not needed, then you may take acetaminophen (Tylenol), naprosyn (Alleve), or ibuprofen (Advil) as needed. 2. Take your usually prescribed medications unless otherwise directed. 3. If you need a refill on your pain medication, please contact your pharmacy.  They will contact our office to request authorization. Prescriptions will not be filled after 5pm or on week-ends. 4. You should follow a light diet the first few days after arrival home, such as soup and crackers, etc.  Be sure to include lots of fluids daily. 5. Most patients will experience some swelling and bruising in the area of the incisions.  Ice packs will help.  Swelling and bruising can take several days to resolve.  6. It is common to experience some constipation if taking pain medication after surgery.  Increasing fluid intake and taking a stool softener (such as Colace) will usually help or prevent this problem from occurring.  A mild laxative (Milk of Magnesia or Miralax) should be taken according to package instructions if there are no bowel movements after 48 hours. 7. Unless discharge instructions indicate otherwise, you may remove your bandages 48 hours after surgery, and you may shower at that time.  You may have steri-strips (small skin tapes) in place directly over the incision.  These strips should be left on the skin for 7-10 days.  If your surgeon used skin glue on the incision, you may shower in 24 hours.  The glue will flake  off over the next 2-3 weeks.  Any sutures or staples will be removed at the office during your follow-up visit. 8. ACTIVITIES:  You may resume regular (light) daily activities beginning the next day--such as daily self-care, walking, climbing stairs--gradually increasing activities as tolerated.  You may have sexual intercourse when it is comfortable.  Refrain from any heavy lifting or straining until approved by your doctor. a. You may drive when you are no longer taking prescription pain medication, you can comfortably wear a seatbelt, and you can safely maneuver your car and apply brakes. b. RETURN TO WORK:  __________________________________________________________ 9. You should see your doctor in the office for a follow-up appointment approximately 2-3 weeks after your surgery.  Make sure that you call for this appointment within a day or two after you arrive home to insure a convenient appointment time. 10. OTHER INSTRUCTIONS: __________________________________________________________________________________________________________________________ __________________________________________________________________________________________________________________________ WHEN TO CALL YOUR DOCTOR: 1. Fever over 101.0 2. Inability to urinate 3. Continued bleeding from incision. 4. Increased pain, redness, or drainage from the incision. 5. Increasing abdominal pain  The clinic staff is available to answer your questions during regular business hours.  Please don't hesitate to call and ask to speak to one of the nurses for clinical concerns.  If you have a medical emergency, go to the nearest emergency room or call 911.  A surgeon from [facility] is always on call at the hospital. [address], [address], [place], [place]  [address] ? [address] [address], [place], [place]   [address] [phone] ? [phone] ? [phone] Web site: [email or web address]  

## 2016-03-28 ENCOUNTER — Encounter (HOSPITAL_COMMUNITY): Payer: Self-pay | Admitting: General Surgery

## 2017-07-18 IMAGING — CT CT ABD-PELV W/ CM
2 of 4 series · 15 of 46 positions shown, 17 images · IV contrast (Omni 300)
Comparison: None.

CLINICAL DATA: RIGHT lower quadrant pain, leukocytosis.

EXAM:
CT ABDOMEN AND PELVIS WITH CONTRAST
TECHNIQUE: Multidetector CT imaging of the abdomen and pelvis was performed
using the standard protocol following bolus administration of
intravenous contrast.
CONTRAST:  100mL DPE59R-HLL IOPAMIDOL (DPE59R-HLL) INJECTION 61%

[Series 2: a/p w/ 5mm · axial · 0.69mm/px · z∈[-568,-118]mm · 12 of 103 slices shown, 14 images]
[im 7/103  soft-tissue]
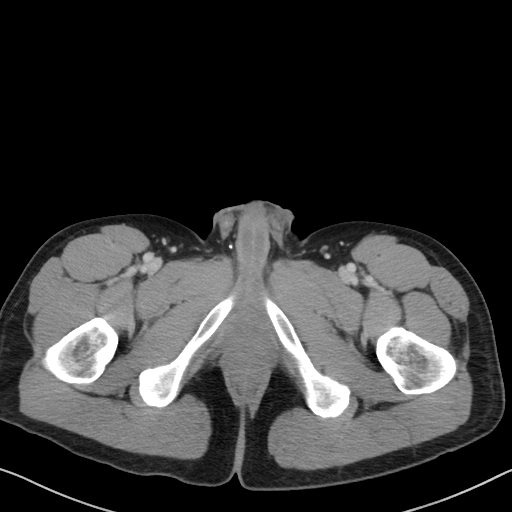
[im 7/103  bone]
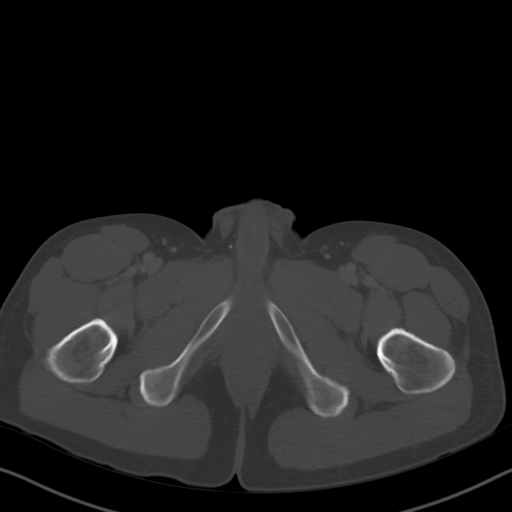
[im 19/103  soft-tissue]
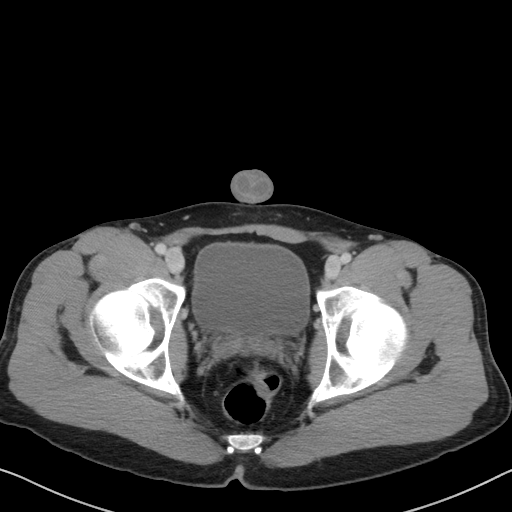
[im 25/103  soft-tissue]
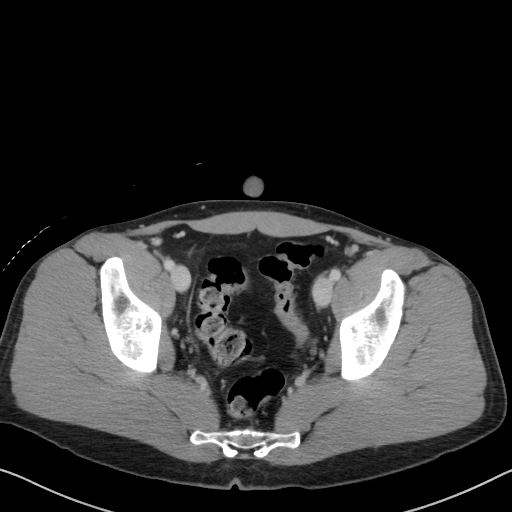
[im 31/103  soft-tissue]
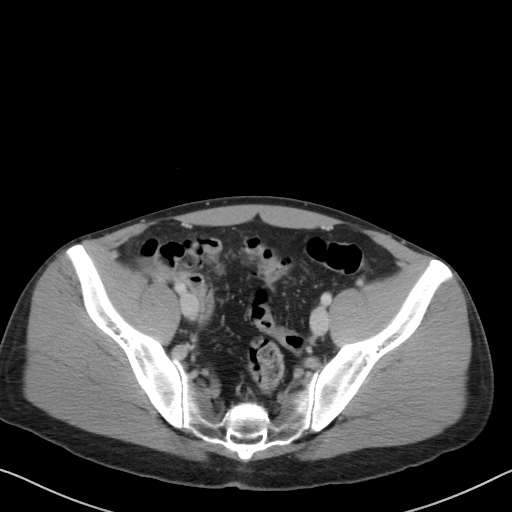
[im 43/103  soft-tissue]
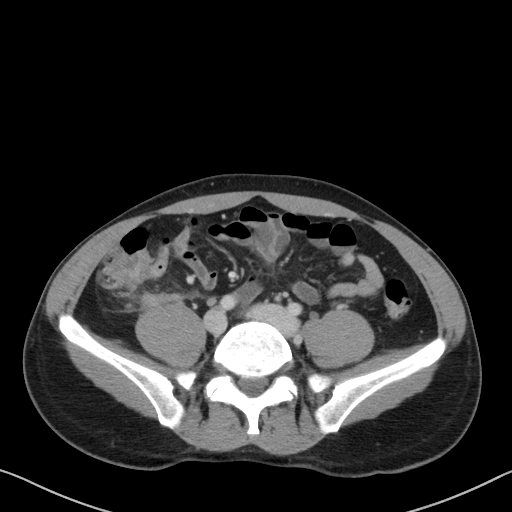
[im 49/103  soft-tissue]
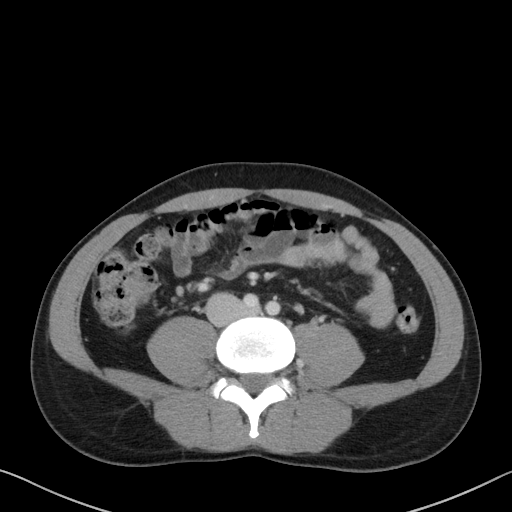
[im 55/103  soft-tissue]
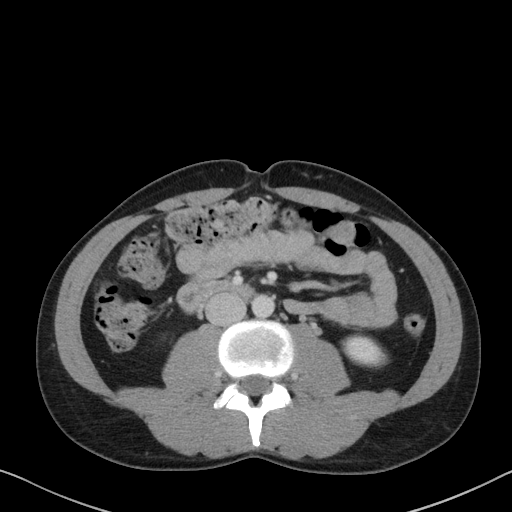
[im 67/103  soft-tissue]
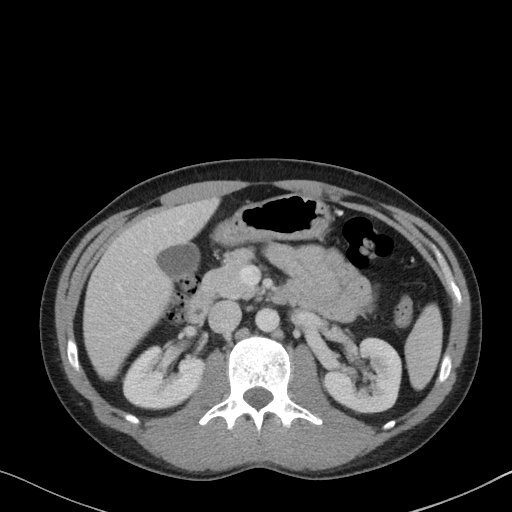
[im 73/103  soft-tissue]
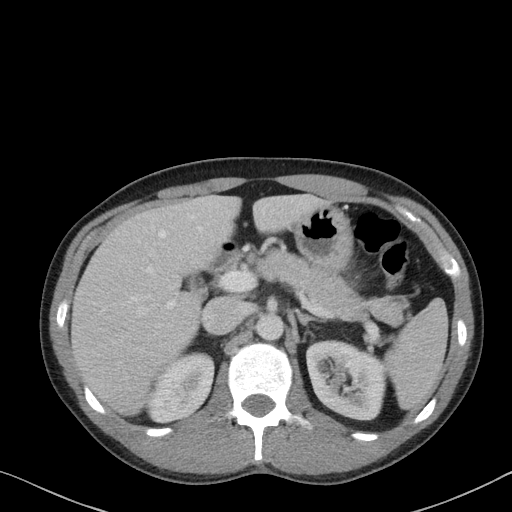
[im 73/103  bone]
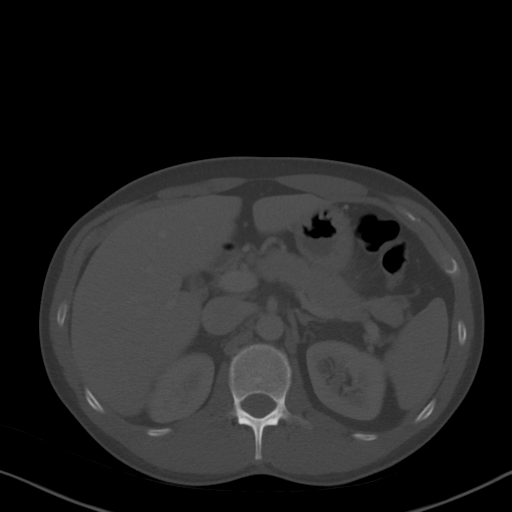
[im 79/103  soft-tissue]
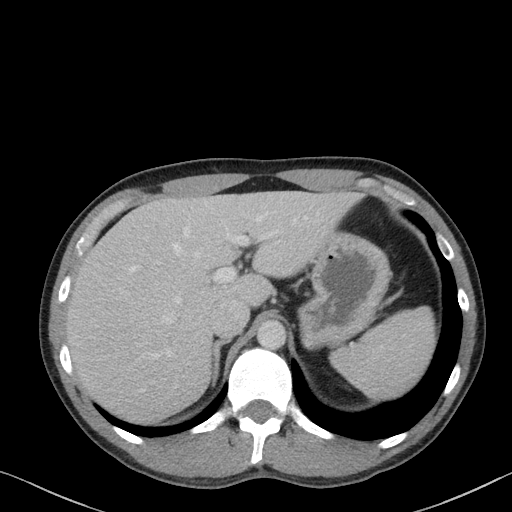
[im 91/103  soft-tissue]
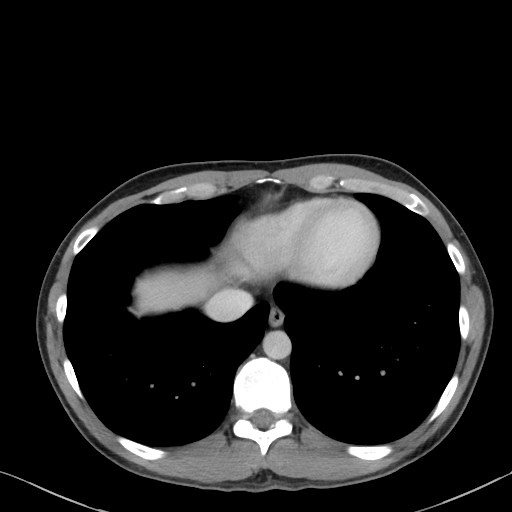
[im 97/103  soft-tissue]
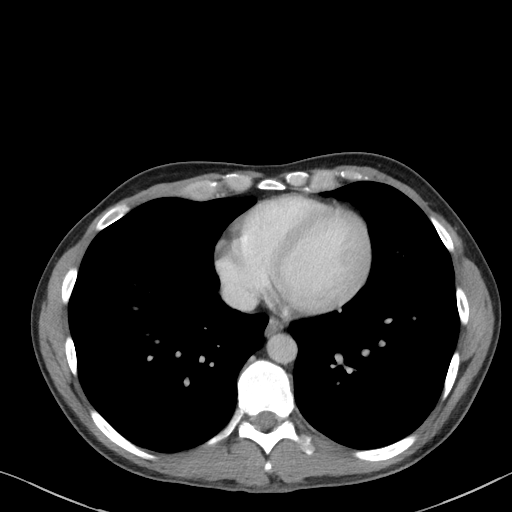

[Series 5: a/p w/ cor · coronal · 0.62mm/px · 3 of 108 slices shown]
[im 36/108  soft-tissue]
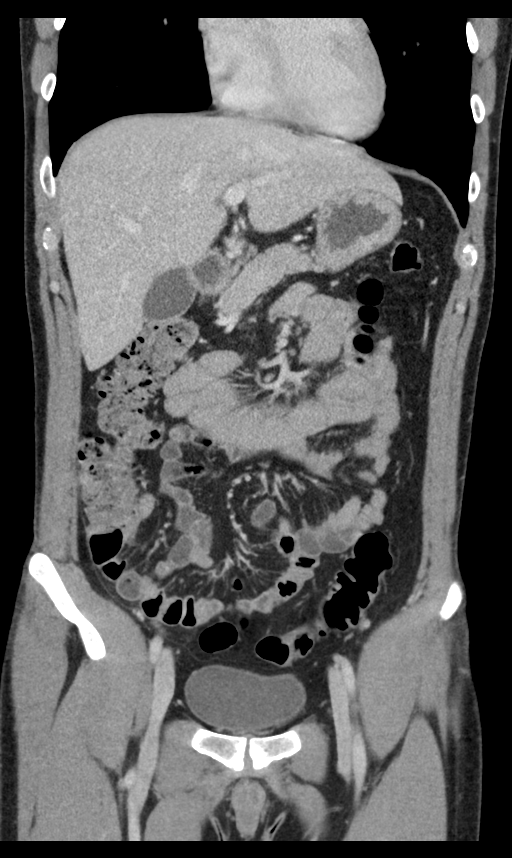
[im 48/108  soft-tissue]
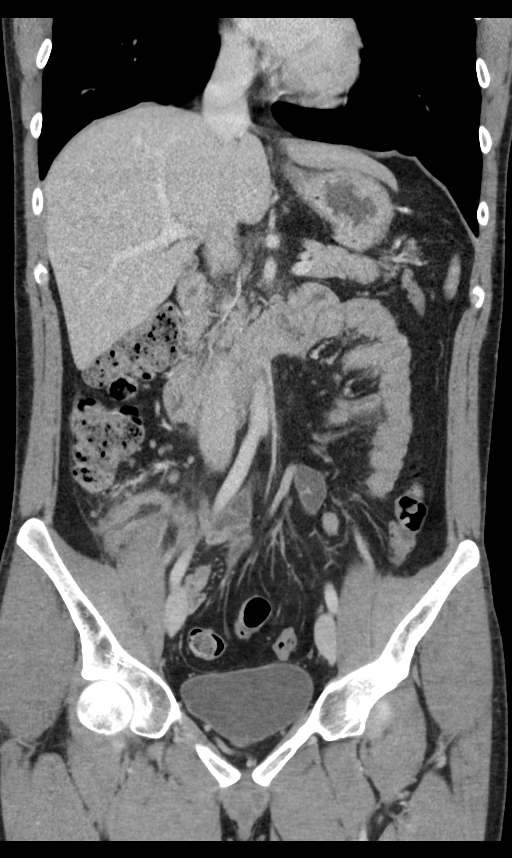
[im 60/108  soft-tissue]
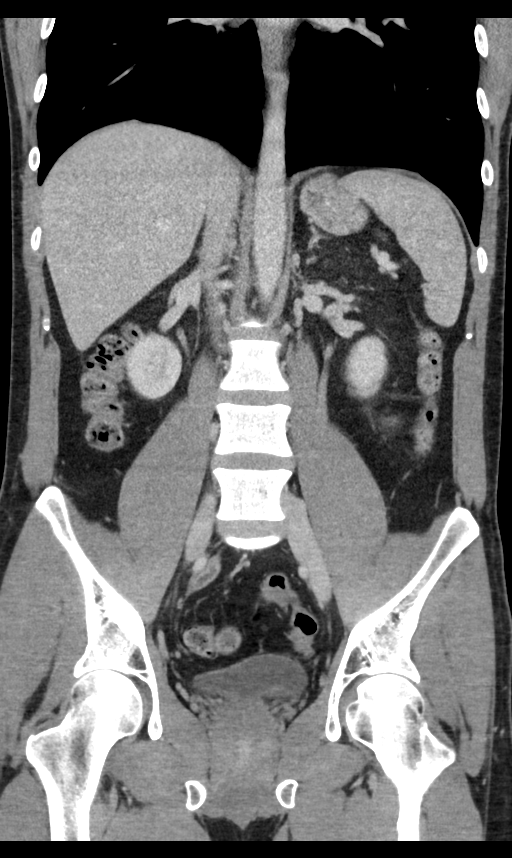

[15 of 46 positions shown; findings below may reference images not displayed]

FINDINGS: LUNG BASES: Included view of the lung bases are clear. Visualized
heart and pericardium are unremarkable.

SOLID ORGANS: The liver, spleen, gallbladder, pancreas and adrenal
glands are unremarkable.

GASTROINTESTINAL TRACT: The stomach, small and large bowel are
normal in course and caliber without inflammatory changes. Appendix
is enlarged, 11 mm, hyperemic with periappendiceal inflammation.

KIDNEYS/ URINARY TRACT: Kidneys are orthotopic, demonstrating
symmetric enhancement. No nephrolithiasis, hydronephrosis or solid
renal masses. 10 mm simple cyst upper pole LEFT kidney. The
unopacified ureters are normal in course and caliber. Urinary
bladder is partially distended and unremarkable.

PERITONEUM/RETROPERITONEUM: Aortoiliac vessels are normal in course
and caliber. No lymphadenopathy by CT size criteria. Prostate size
is normal. No intraperitoneal free air. Trace free fluid RIGHT
pericolic gutter. No abscess.

SOFT TISSUE/OSSEOUS STRUCTURES: Non-suspicious. Small fat containing
umbilical hernia. Small L5-S1 broad-based disc osteophyte complex.
Small fat containing umbilical hernia.
IMPRESSION: Acute uncomplicated appendicitis.

Acute findings discussed with and reconfirmed by GHAUSUDDIN LEMOINE
on 03/26/2016 at [DATE].
